# Patient Record
Sex: Female | Born: 1985 | Race: Asian | Hispanic: No | Marital: Married | Smoking: Current every day smoker
Health system: Southern US, Community
[De-identification: ages and names within clinical notes are randomized; demographics above are authoritative.]

## PROBLEM LIST (undated history)

## (undated) DIAGNOSIS — H469 Unspecified optic neuritis: Secondary | ICD-10-CM

## (undated) HISTORY — PX: WISDOM TOOTH EXTRACTION: SHX21

## (undated) HISTORY — PX: ABDOMINAL HERNIA REPAIR: SHX539

---

## 2017-07-02 ENCOUNTER — Encounter (HOSPITAL_COMMUNITY): Payer: Self-pay | Admitting: Emergency Medicine

## 2017-07-02 ENCOUNTER — Other Ambulatory Visit: Payer: Self-pay

## 2017-07-02 ENCOUNTER — Inpatient Hospital Stay (HOSPITAL_COMMUNITY)
Admission: EM | Admit: 2017-07-02 | Discharge: 2017-07-07 | DRG: 059 | Disposition: A | Discharge status: Home / Self Care | Payer: No Typology Code available for payment source | Attending: Internal Medicine | Admitting: Internal Medicine

## 2017-07-02 DIAGNOSIS — H547 Unspecified visual loss: Secondary | ICD-10-CM | POA: Diagnosis not present

## 2017-07-02 DIAGNOSIS — G35 Multiple sclerosis: Secondary | ICD-10-CM | POA: Diagnosis not present

## 2017-07-02 DIAGNOSIS — F17210 Nicotine dependence, cigarettes, uncomplicated: Secondary | ICD-10-CM | POA: Diagnosis present

## 2017-07-02 DIAGNOSIS — H469 Unspecified optic neuritis: Secondary | ICD-10-CM

## 2017-07-02 DIAGNOSIS — Z885 Allergy status to narcotic agent status: Secondary | ICD-10-CM

## 2017-07-02 DIAGNOSIS — G4700 Insomnia, unspecified: Secondary | ICD-10-CM | POA: Diagnosis present

## 2017-07-02 DIAGNOSIS — R51 Headache: Secondary | ICD-10-CM | POA: Diagnosis present

## 2017-07-02 DIAGNOSIS — G360 Neuromyelitis optica [Devic]: Secondary | ICD-10-CM | POA: Diagnosis present

## 2017-07-02 HISTORY — DX: Unspecified optic neuritis: H46.9

## 2017-07-02 LAB — BASIC METABOLIC PANEL
ANION GAP: 7 (ref 5–15)
BUN: <5 mg/dL — ABNORMAL LOW (ref 6–20)
CHLORIDE: 104 mmol/L (ref 101–111)
CO2: 25 mmol/L (ref 22–32)
Calcium: 9 mg/dL (ref 8.9–10.3)
Creatinine, Ser: 0.79 mg/dL (ref 0.44–1.00)
GFR calc Af Amer: >60 mL/min (ref >60)
GLUCOSE: 115 mg/dL — AB (ref 65–99)
POTASSIUM: 4 mmol/L (ref 3.5–5.1)
Sodium: 136 mmol/L (ref 135–145)

## 2017-07-02 LAB — CBC WITH DIFFERENTIAL/PLATELET
Basophils Absolute: 0 10*3/uL (ref 0.0–0.1)
Basophils Relative: 0 %
EOS ABS: 0.1 10*3/uL (ref 0.0–0.7)
EOS PCT: 1 %
HEMATOCRIT: 39.3 % (ref 36.0–46.0)
Hemoglobin: 13.2 g/dL (ref 12.0–15.0)
LYMPHS ABS: 2.3 10*3/uL (ref 0.7–4.0)
LYMPHS PCT: 34 %
MCH: 32.1 pg (ref 26.0–34.0)
MCHC: 33.6 g/dL (ref 30.0–36.0)
MCV: 95.6 fL (ref 78.0–100.0)
MONO ABS: 0.7 10*3/uL (ref 0.1–1.0)
Monocytes Relative: 10 %
Neutro Abs: 3.8 10*3/uL (ref 1.7–7.7)
Neutrophils Relative %: 55 %
Platelets: 185 10*3/uL (ref 150–400)
RBC: 4.11 MIL/uL (ref 3.87–5.11)
RDW: 12.3 % (ref 11.5–15.5)
WBC: 6.8 10*3/uL (ref 4.0–10.5)

## 2017-07-02 LAB — I-STAT BETA HCG BLOOD, ED (MC, WL, AP ONLY): I-stat hCG, quantitative: <5 m[IU]/mL (ref <5)

## 2017-07-02 LAB — I-STAT TROPONIN, ED: TROPONIN I, POC: 0 ng/mL (ref 0.00–0.08)

## 2017-07-02 NOTE — ED Notes (Signed)
Phlebotomy at bedside.

## 2017-07-02 NOTE — ED Triage Notes (Signed)
Pt to ER [facility] for visual loss that occurred Saturday. Seen by ophthalmology and retinal detachment ruled out. Sent here for concern of optic neuritis and is to be seen by neuro. Paperwork has MD contact info. Pt is to have MRI

## 2017-07-02 NOTE — ED Provider Notes (Signed)
[facility] Provider Note   CSN: [id number] Arrival date & time: 07/02/17  1806     History   Chief Complaint Chief Complaint  Patient presents with  . Loss of Vision    HPI Jeanine LuzJami Booth is a 31 y.o. female who presents the emergency department with chief complaint of visual field defect.Her sxs began on Saturday 06/29/17 and she noticed a feeling that something is in her eye. She said that at about 2:00 PM she could only see the upper half of her visual field. She states that it seems like her vision is underwater.[facility] referred there by an optometrist to finish up her exam.  He was then sent to the emergency department to rule out optic neuritis with MRI.  Patient has a history of heavy smoking.  She denies any other neurologic abnormalities. The ophthalmologist did a dilated eye exam that shoed no macular edema or other abnormalities. Normal eye pressures.  VA OD: 20/20, OS 20/80 with corrective lenses.  HPI  History reviewed. No pertinent past medical history.  There are no active problems to display for this patient.   History reviewed. No pertinent surgical history.  OB History    No data available       Home Medications    Prior to Admission medications   Not on File    Family History History reviewed. No pertinent family history.  Social History Social History   Tobacco Use  . Smoking status: Current Every Day Smoker    Packs/day: 0.75    Types: Cigarettes  . Smokeless tobacco: Never Used  Substance Use Topics  . Alcohol use: No    Frequency: Never  . Drug use: No     Allergies   Patient has no allergy information on record.   Review of Systems Review of Systems Ten systems reviewed and are negative for acute change, except as noted in the HPI.    Physical Exam Updated Vital Signs BP 111/84   Pulse (!) 59   Temp 98.6 F (37 C) (Oral)   Resp 16   LMP 06/25/2017    SpO2 98%   Physical Exam  Constitutional: She is oriented to person, place, and time. She appears well-developed and well-nourished. No distress.  HENT:  Head: Normocephalic and atraumatic.  Eyes: Conjunctivae and EOM are normal. Pupils are equal, round, and reactive to light. No scleral icterus.  Neck: Normal range of motion.  Cardiovascular: Normal rate, regular rhythm and normal heart sounds. Exam reveals no gallop and no friction rub.  No murmur heard. Pulmonary/Chest: Effort normal and breath sounds normal. No respiratory distress.  Abdominal: Soft. Bowel sounds are normal. She exhibits no distension and no mass. There is no tenderness. There is no guarding.  Neurological: She is alert and oriented to person, place, and time.  Skin: Skin is warm and dry. She is not diaphoretic.  Psychiatric: Her behavior is normal.  Nursing note and vitals reviewed.    ED Treatments / Results  Labs (all labs ordered are listed, but only abnormal results are displayed) Labs Reviewed  BASIC METABOLIC PANEL - Abnormal; Notable for the following components:      Result Value   Glucose, Bld 115 (*)    BUN <5 (*)    All other components within normal limits  CBC WITH DIFFERENTIAL/PLATELET    EKG  EKG Interpretation None       Radiology No results found.  Procedures  Procedures (including critical care time)  Medications Ordered in ED Medications - No data to display   Initial Impression / Assessment and Plan / ED Course  I have reviewed the triage vital signs and the nursing notes.  Pertinent labs & imaging results that were available during my care of the patient were reviewed by me and considered in my medical decision making (see chart for details).     .patient is in MRI. The patient taken in sign out by PA Ward.   Final Clinical Impressions(s) / ED Diagnoses   Final diagnoses:  None    ED Discharge Orders    None       Arthor Captain, PA-C 07/03/17 0118      Linwood Dibbles, MD 07/04/17 [phone]

## 2017-07-03 ENCOUNTER — Emergency Department (HOSPITAL_COMMUNITY): Payer: No Typology Code available for payment source

## 2017-07-03 ENCOUNTER — Encounter (HOSPITAL_COMMUNITY): Payer: Self-pay | Admitting: Internal Medicine

## 2017-07-03 ENCOUNTER — Other Ambulatory Visit: Payer: Self-pay

## 2017-07-03 DIAGNOSIS — G35 Multiple sclerosis: Principal | ICD-10-CM | POA: Diagnosis present

## 2017-07-03 DIAGNOSIS — F17210 Nicotine dependence, cigarettes, uncomplicated: Secondary | ICD-10-CM | POA: Diagnosis present

## 2017-07-03 DIAGNOSIS — H469 Unspecified optic neuritis: Secondary | ICD-10-CM | POA: Diagnosis not present

## 2017-07-03 DIAGNOSIS — G360 Neuromyelitis optica [Devic]: Secondary | ICD-10-CM | POA: Diagnosis present

## 2017-07-03 DIAGNOSIS — H547 Unspecified visual loss: Secondary | ICD-10-CM | POA: Diagnosis present

## 2017-07-03 DIAGNOSIS — R51 Headache: Secondary | ICD-10-CM | POA: Diagnosis present

## 2017-07-03 DIAGNOSIS — Z885 Allergy status to narcotic agent status: Secondary | ICD-10-CM | POA: Diagnosis not present

## 2017-07-03 DIAGNOSIS — G4700 Insomnia, unspecified: Secondary | ICD-10-CM | POA: Diagnosis present

## 2017-07-03 HISTORY — DX: Unspecified optic neuritis: H46.9

## 2017-07-03 MED ORDER — GADOBENATE DIMEGLUMINE 529 MG/ML IV SOLN
15.0000 mL | Freq: Once | INTRAVENOUS | Status: AC | PRN
  Administered 2017-07-03: 12 mL via INTRAVENOUS

## 2017-07-03 MED ORDER — ONDANSETRON HCL 4 MG/2ML IJ SOLN: 4.0000 mg | Freq: Four times a day (QID) | INTRAMUSCULAR | Status: DC | PRN

## 2017-07-03 MED ORDER — ENOXAPARIN SODIUM 40 MG/0.4ML ~~LOC~~ SOLN
40.0000 mg | SUBCUTANEOUS | Status: DC
  Filled 2017-07-03: qty 0.4

## 2017-07-03 MED ORDER — ONDANSETRON HCL 4 MG PO TABS: 4.0000 mg | ORAL_TABLET | Freq: Four times a day (QID) | ORAL | Status: DC | PRN

## 2017-07-03 MED ORDER — SODIUM CHLORIDE 0.9 % IV SOLN
1000.0000 mg | Freq: Every day | INTRAVENOUS | Status: DC
  Filled 2017-07-03: qty 8

## 2017-07-03 MED ORDER — MELATONIN 3 MG PO TABS
3.0000 mg | ORAL_TABLET | Freq: Every evening | ORAL | Status: DC | PRN
  Administered 2017-07-06: 3 mg via ORAL
  Filled 2017-07-03 (×2): qty 1

## 2017-07-03 MED ORDER — METHYLPREDNISOLONE SODIUM SUCC 1000 MG IJ SOLR
1000.0000 mg | INTRAMUSCULAR | Status: AC
  Administered 2017-07-03 – 2017-07-07 (×5): 1000 mg via INTRAVENOUS
  Filled 2017-07-03 (×5): qty 8

## 2017-07-03 NOTE — Plan of Care (Signed)
  Progressing Health Behavior/Discharge Planning: Ability to manage health-related needs will improve 07/03/2017 1106 - Progressing by Angela Nevin, RN Clinical Measurements: Ability to maintain clinical measurements within normal limits will improve 07/03/2017 1106 - Progressing by Angela Nevin, RN

## 2017-07-03 NOTE — ED Notes (Signed)
Pt. To CT via stretcher. 

## 2017-07-03 NOTE — Consult Note (Addendum)
Neurology Consultation Reason for Consult: left eye vision loss Referring Physician: Dr. Daun Peacock  History is obtained from: Patient  HPI: Tamara Booth is a 31 y.o. female  with no significant past medical history presents with eye pain that began on Thursday progressing to gradual left eye vision loss. She states that first first noticed a headache on Thursday behind her left eye and started noticing that she had blurred vision. This gradually progressed until Sunday her vision became much worse -approximately from  50% to 15%. She tried to go to urgent care, however was instructed to go to an ophthalmologist. Today the patient was examined by an ophthalmologist who was concerned that she may have optic neuritis. Patient had impaired visual acuity and color vision and fundus showed no evidence of papilledema. She was instructed to come to the emergency room.    The patient denies any history of extremity numbness or weakness, memory impairment or gait imbalance. No family history of MS. Brother has alopecia.  MRI of the brain with contrast and MRI orbits with contrast was obtained. The showed enhancement of the left optic nerve as well as multiple supratentorial and infratentorial white matter lesions some of them enhancing suggestive of multiple sclerosis.   ROS: A 14 point ROS was performed and is negative except as noted in the HPI.   History reviewed. No pertinent past medical history.   History reviewed. No pertinent family history. Brother has alopecia.   Social History:  reports that she has been smoking cigarettes.  She has been smoking about 0.75 packs per day. she has never used smokeless tobacco. She reports that she does not drink alcohol or use drugs.   Exam: Current vital signs: BP 107/74   Pulse 63   Temp 98.6 F (37 C) (Oral)   Resp 18   LMP 06/25/2017   SpO2 97%  Vital signs in last 24 hours: Temp:  [98.6 F (37 C)] 98.6 F (37 C) (11/20 1818) Pulse Rate:  [51-80]  63 (11/21 0245) Resp:  [15-18] 18 (11/20 2230) BP: (102-126)/(70-89) 107/74 (11/21 0245) SpO2:  [96 %-100 %] 97 % (11/21 0245)   Physical Exam  Constitutional: Appears well-developed and well-nourished.  Psych: Affect appropriate to situation Eyes: No scleral injection HENT: No OP obstrucion Head: Normocephalic.  Cardiovascular: Normal rate and regular rhythm.  Respiratory: Effort normal and breath sounds normal to anterior ascultation GI: Soft.  No distension. There is no tenderness.  Skin: WDI  Neuro: Mental Status: Patient is awake, alert, oriented to person, place, month, year, and situation. Patient is able to give a clear and coherent history. No signs of aphasia or neglect Fundus: no obvious papilledema  Cranial Nerves: II: Pupils are dilated, round, and reactive to light. Reduced VF in left eye, VA; finger counting. Color vision impaired. R eye : VF and VA normal.  III,IV, VI: EOMI without ptosis or diploplia.  V: Facial sensation is symmetric to temperature VII: Facial movement is symmetric.  VIII: hearing is intact to voice X: Uvula elevates symmetrically XI: Shoulder shrug is symmetric. XII: tongue is midline without atrophy or fasciculations.  Motor Tone is normal. Bulk is normal. 5/5 strength was present in all four extremities.  Sensory: Sensation is symmetric to light touch and temperature in the arms and legs. Deep Tendon Reflexes: 2+ and symmetric in the biceps and patellae.  Plantars: Toes are downgoing bilaterally.  Cerebellar: FNF and HKS are intact bilaterally   I reviewed her MRI of the brain and orbit  ASSESSMENT AND PLAN 31 y.o. female  with no significant past medical history with painful left eye vision loss. MRI orbits consistent with optic neuritis with MRI brain showing multiple white matter hyperintensities and some enhancing lesions. Based on clinical history and MRI findings, this is consistent with MS.   Left eye retrobulbar optic  neuritis Multiple sclerosis  Plan 1 g Solu-Medrol IV to 5 days Pantoprazole 40 mg daily MRI C-spine to assess for any lesions I do not feel patient will need a lumbar puncture as her exam and MRI appears to be consistent with diagnosis of MS. If MRI C-spine shows longitudinal lesions, then we can order NMO antibodies.   Most importantly she will need expedited outpatient follow-up ( 1-2 weeks) at discharge with Dr. Despina Ariasichard Sater or any neuro- immunologist  to start disease modifying therapy for MS to prevent relapses as soon as possible.    Georgiana SpinnerSushanth Riane Rung MD [facility] [phone]  If 7pm to 7am, please call on call as listed on AMION.

## 2017-07-03 NOTE — Progress Notes (Signed)
PROGRESS NOTE    Tamara Booth  KCL:[id number] DOB: November 05, 1985 DOA: 07/02/2017 PCP: Juluis Rainier, MD   Chief Complaint  Patient presents with  . Loss of Vision    Brief Narrative:  HPI on 07/03/2017 by Dr. Madelyn Flavors Tamara Booth is a 31 y.o. female without current past medical history who presents with complaints of having a visual field deficit that has progressively worsened over the last 4 days.  Patient noted initially noted being able to see the sapproximately 50% of her upper visual field, but now only reports being able to see about 15% of her upper visual field.  Approximately 6 days ago patient noted having headaches and some left eye pain with blurred vision. She was evaluated by an ophthalmologist who ultimately sent to the emergency department for further evaluation for concern for optic neuritis.  Patient denies having any focal weakness, nausea, vomiting, fever, chills, gait disturbance.  Patient notes no significant family history of multiple sclerosis to her knowledge.  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 Abnormal enhancement of the cisternal segment of the left optic nerve, consistent with optic neuritis. -Neurology consulted and appreciated, recommending 5 days of high-dose steroids -MRI C-spine pending  Tobacco abuse -discussed cessation  Insomnia -continue melatonin  DVT Prophylaxis    Code Status: lovenox  Family Communication: Husband at bedside  Disposition Plan: admitted, pending 4 additional doses of solumedrol  Consultants Neurology  Procedures  None  Antibiotics   Anti-infectives (From admission, onward)   None      Subjective:   Tamara Booth seen and examined today.  Continues to have  visual field deficits of the left eye. Denies chest pain, shortness of breath, abdominal pain, nausea, vomiting, diarrhea, constipation.   Objective:   Vitals:   07/03/17 0245 07/03/17 0506 07/03/17 0510 07/03/17 1316  BP: 107/74  101/61 (!) 110/53  Pulse: 63  (!) 58 72  Resp:   16 16  Temp:   98.6 F (37 C) 99.2 F (37.3 C)  TempSrc:   Oral Oral  SpO2: 97%  100% 100%  Weight:  61.3 kg (135 lb 1.6 oz)    Height:  5\' 8"  (1.727 m)      Intake/Output Summary (Last 24 hours) at 07/03/2017 1449 Last data filed at 07/03/2017 0929 Gross per 24 hour  Intake 260 ml  Output -  Net 260 ml   Filed Weights   07/03/17 0506  Weight: 61.3 kg (135 lb 1.6 oz)    Exam  General: Well developed, well nourished, NAD, appears stated age  HEENT: NCAT, PERRLA, EOMI, Anicteic Sclera, mucous membranes moist.   Neck: Supple, no JVD, no masses  Cardiovascular: S1 S2 auscultated, no rubs, murmurs or gallops. Regular rate and rhythm.  Respiratory: Clear to auscultation bilaterally with equal chest rise  Abdomen: Soft, nontender, nondistended, + bowel sounds  Extremities: warm dry without cyanosis clubbing or edema  Neuro: AAOx3, reduced lower visual field of the left eye, otherwise, nonfocal   Skin: Without rashes exudates or nodules  Psych: Normal affect and demeanor with intact judgement and insight   Data Reviewed: I have personally reviewed following labs and imaging studies  CBC: Recent Labs  Lab 07/02/17 1834  WBC 6.8  NEUTROABS 3.8  HGB 13.2  HCT 39.3  MCV 95.6  PLT  185   Basic Metabolic Panel: Recent Labs  Lab 07/02/17 1834  NA 136  K 4.0  CL 104  CO2 25  GLUCOSE 115*  BUN <5*  CREATININE 0.79  CALCIUM 9.0   GFR: Estimated Creatinine Clearance: 96.8 mL/min (by C-G formula based on SCr of 0.79 mg/dL). Liver Function Tests: No results for input(s): AST, ALT, ALKPHOS, BILITOT, PROT, ALBUMIN in the last 168 hours. No results for input(s): LIPASE, AMYLASE in the  last 168 hours. No results for input(s): AMMONIA in the last 168 hours. Coagulation Profile: No results for input(s): INR, PROTIME in the last 168 hours. Cardiac Enzymes: No results for input(s): CKTOTAL, CKMB, CKMBINDEX, TROPONINI in the last 168 hours. BNP (last 3 results) No results for input(s): PROBNP in the last 8760 hours. HbA1C: No results for input(s): HGBA1C in the last 72 hours. CBG: No results for input(s): GLUCAP in the last 168 hours. Lipid Profile: No results for input(s): CHOL, HDL, LDLCALC, TRIG, CHOLHDL, LDLDIRECT in the last 72 hours. Thyroid Function Tests: No results for input(s): TSH, T4TOTAL, FREET4, T3FREE, THYROIDAB in the last 72 hours. Anemia Panel: No results for input(s): VITAMINB12, FOLATE, FERRITIN, TIBC, IRON, RETICCTPCT in the last 72 hours. Urine analysis: No results found for: COLORURINE, APPEARANCEUR, LABSPEC, PHURINE, GLUCOSEU, HGBUR, BILIRUBINUR, KETONESUR, PROTEINUR, UROBILINOGEN, NITRITE, LEUKOCYTESUR Sepsis Labs: @LABRCNTIP (procalcitonin:4,lacticidven:4)  )No results found for this or any previous visit (from the past 240 hour(s)).    Radiology Studies: [name] ZO Contrast  Result Date: 07/03/2017 CLINICAL DATA:  Visual field defect. Loss of upper half of the visual field. EXAM: MRI HEAD AND ORBITS WITHOUT AND WITH CONTRAST TECHNIQUE: Multiplanar, multiecho pulse sequences of the brain and surrounding structures were obtained without and with intravenous contrast. Multiplanar, multiecho pulse sequences of the orbits and surrounding structures were obtained including fat saturation techniques, before and after intravenous contrast administration. CONTRAST:  12mL MULTIHANCE GADOBENATE DIMEGLUMINE 529 MG/ML IV SOLN COMPARISON:  None. FINDINGS: MRI HEAD FINDINGS Brain: There is a tiny cyst measuring 11 mm. There is no acute infarct or acute hemorrhage. No mass lesion, hydrocephalus, dural abnormality or extra-axial collection. There are multiple  hyperintense T2-weighted signal lesionswithin the subcortical and deep supratentorial white matter and within the left cerebellum and brainstem. The most of the supratentorial lesions are oriented perpendicularly to the long axis of the lateral ventricles. The lesion in the right frontal operculum shows contrast enhancement. No other contrast-enhancing lesions. Possible diffusion restriction at the lesion of the left middle cerebellar peduncles. No age-advanced or lobar predominant atrophy. No chronic microhemorrhage or superficial siderosis. Vascular: Major intracranial arterial and venous sinus flow voids are preserved. Skull and upper cervical spine: The visualized skull base, calvarium, upper cervical spine and extracranial soft tissues are normal. MRI ORBITS FINDINGS Orbits: --Globes: Normal. --Bony orbit: Normal. --Preseptal soft tissues: Normal. --Intra- and extraconal orbital fat:  Normal. --Optic nerves: There is abnormal contrast enhancement of the cisternal segment of the left optic nerve, proximal to its entry into the optic canal. There is no abnormal enhancement or T2-weighted signal of the intraorbital optic nerve. The right optic nerve is normal. --Lacrimal glands and fossae: Normal. --Extraocular muscles: Normal. The Visualized sinuses:  No fluid levels or advanced mucosal thickening. Soft tissues: Normal. IMPRESSION: 1. Multiple supratentorial and infratentorial white matter lesions, including a contrast-enhancing lesion of the right frontal operculum. The pattern is most consistent with demyelinating disease and both multiple sclerosis and neuromyelitis optica spectrum disorders are possibilities. CSF sampling should be considered. 2. Abnormal  enhancement of the cisternal segment of the left optic nerve, proximal to its entry into the optic canal is also consistent with optic neuritis in the setting of demyelinating disease. 3. No acute ischemia. Electronically Signed   By: Deatra RobinsonKevin  Herman M.D.    On: 07/03/2017 02:46   Mr Rockwell GermanyOrbits W ZOWo Contrast  Result Date: 07/03/2017 CLINICAL DATA:  Visual field defect. Loss of upper half of the visual field. EXAM: MRI HEAD AND ORBITS WITHOUT AND WITH CONTRAST TECHNIQUE: Multiplanar, multiecho pulse sequences of the brain and surrounding structures were obtained without and with intravenous contrast. Multiplanar, multiecho pulse sequences of the orbits and surrounding structures were obtained including fat saturation techniques, before and after intravenous contrast administration. CONTRAST:  12mL MULTIHANCE GADOBENATE DIMEGLUMINE 529 MG/ML IV SOLN COMPARISON:  None. FINDINGS: MRI HEAD FINDINGS Brain: There is a tiny cyst measuring 11 mm. There is no acute infarct or acute hemorrhage. No mass lesion, hydrocephalus, dural abnormality or extra-axial collection. There are multiple hyperintense T2-weighted signal lesionswithin the subcortical and deep supratentorial white matter and within the left cerebellum and brainstem. The most of the supratentorial lesions are oriented perpendicularly to the long axis of the lateral ventricles. The lesion in the right frontal operculum shows contrast enhancement. No other contrast-enhancing lesions. Possible diffusion restriction at the lesion of the left middle cerebellar peduncles. No age-advanced or lobar predominant atrophy. No chronic microhemorrhage or superficial siderosis. Vascular: Major intracranial arterial and venous sinus flow voids are preserved. Skull and upper cervical spine: The visualized skull base, calvarium, upper cervical spine and extracranial soft tissues are normal. MRI ORBITS FINDINGS Orbits: --Globes: Normal. --Bony orbit: Normal. --Preseptal soft tissues: Normal. --Intra- and extraconal orbital fat:  Normal. --Optic nerves: There is abnormal contrast enhancement of the cisternal segment of the left optic nerve, proximal to its entry into the optic canal. There is no abnormal enhancement or T2-weighted  signal of the intraorbital optic nerve. The right optic nerve is normal. --Lacrimal glands and fossae: Normal. --Extraocular muscles: Normal. The Visualized sinuses:  No fluid levels or advanced mucosal thickening. Soft tissues: Normal. IMPRESSION: 1. Multiple supratentorial and infratentorial white matter lesions, including a contrast-enhancing lesion of the right frontal operculum. The pattern is most consistent with demyelinating disease and both multiple sclerosis and neuromyelitis optica spectrum disorders are possibilities. CSF sampling should be considered. 2. Abnormal enhancement of the cisternal segment of the left optic nerve, proximal to its entry into the optic canal is also consistent with optic neuritis in the setting of demyelinating disease. 3. No acute ischemia. Electronically Signed   By: Deatra RobinsonKevin  Herman M.D.   On: 07/03/2017 02:46     Scheduled Meds: . enoxaparin (LOVENOX) injection  40 mg Subcutaneous Q24H   Continuous Infusions: . methylPREDNISolone (SOLU-MEDROL) injection Stopped (07/03/17 0739)     LOS: 0 days   Time Spent in minutes   30 minutes  Deondra Labrador D.O. on 07/03/2017 at 2:49 PM  Between 7am to 7pm - Pager - [phone]  After 7pm go to [email or web address] - password [email or web address]  And look for the night coverage person covering for me after hours  [facility] Office  [phone]

## 2017-07-03 NOTE — ED Provider Notes (Signed)
Care assumed from previous provider PA Harris. Please see note for further details. Case discussed, plan agreed upon. Briefly, patient is a 31 y.o. female sent to ER from ophthalmology for imaging to rule out optic neuritis.  Patient has been experiencing unilateral visual filed loss. Will follow up on pending MRI brain and orbits with likely neurology consultation.  MRI shows multiple lesions concerning for multiple sclerosis and neuromyelitis optica spectrum disorders as well as abnormal enhancement of the left optic nerve consistent with optic neuritis.   Neurology consulted who evaluated patient and recommends 1g solu-medrol daily and medical admission.   Hospitalist consulted who will admit.     Noelie Renfrow, Chase PicketJaime Pilcher, PA-C 07/03/17 0335    Cy BlamerPalumbo, April, MD 07/03/17 [id number]

## 2017-07-03 NOTE — ED Notes (Signed)
Spoke with MRI, MR cervical spine is able to be outpatient.

## 2017-07-03 NOTE — Plan of Care (Signed)
Patient progressing 

## 2017-07-03 NOTE — ED Notes (Signed)
Admitting MD at bedside.

## 2017-07-03 NOTE — Progress Notes (Signed)
Patient received from ED. Patient is alert and oriented. Vital signs are stable. Skin assessment done with another nurse found intact. Patient denies for pain. Patient given instructions  about call bell, phone and unit routine. Bed in low position and side rail up x2. Call bell in reach.

## 2017-07-03 NOTE — H&P (Signed)
History and Physical    Tamara Booth RUE:[id number] DOB: 1986/05/24 DOA: 07/02/2017  Referring MD/NP/PA:Jamie Ward, PA-C PCP: Juluis Rainier, MD  Patient coming from: [place] from [facility]  Chief Complaint: Visual change  I have personally briefly reviewed patient's old medical records in [facility]   HPI: Tamara Booth is a 31 y.o. female without current past medical history who presents with complaints of having a visual field deficit that has progressively worsened over the last 4 days.  Patient noted initially noted being able to see the sapproximately 50% of her upper visual field, but now only reports being able to see about 15% of her upper visual field.  Approximately 6 days ago patient noted having headaches and some left eye pain with blurred vision. She was evaluated by an ophthalmologist who ultimately sent to the emergency department for further evaluation for concern for optic neuritis.  Patient denies having any focal weakness, nausea, vomiting, fever, chills, gait disturbance.  Patient notes no significant family history of multiple sclerosis to her knowledge.  ED Course: Patient was seen to be afebrile with vital signs relatively within normal limits.  Labs were relatively unremarkable.  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  Review of Systems  Constitutional: Negative for chills, fever and weight loss.  HENT: Negative for ear discharge and nosebleeds.   Eyes: Positive for pain.       Positive for change in vision  Respiratory: Negative for cough and sputum production.   Cardiovascular: Negative for chest pain and orthopnea.  Gastrointestinal: Negative for abdominal pain, constipation, diarrhea and vomiting.  Genitourinary: Negative for dysuria and frequency.  Musculoskeletal: Negative for myalgias and neck pain.  Skin:  Negative for itching and rash.  Neurological: Positive for tremors.  Psychiatric/Behavioral: Negative for hallucinations and substance abuse.    History reviewed. No pertinent past medical history.  Past Surgical History:  Procedure Laterality Date  . ABDOMINAL HERNIA REPAIR     at the age of 27     reports that she has been smoking cigarettes.  She has been smoking about 0.75 packs per day. she has never used smokeless tobacco. She reports that she does not drink alcohol or use drugs.  Allergies  Allergen Reactions  . Vicodin [Hydrocodone-Acetaminophen] Shortness Of Breath and Itching    History reviewed. No pertinent family history of multiple sclerosis.  Prior to Admission medications   Medication Sig Start Date End Date Taking? Authorizing Provider  Melatonin 2.5 MG CAPS Take 2.5 mg by mouth at bedtime.   Yes [provider]    Physical Exam:  Constitutional: NAD, calm, comfortable Vitals:   07/02/17 2330 07/02/17 2345 07/03/17 0000 07/03/17 0245  BP: 110/80 107/77 103/71 107/74  Pulse: 66 65 62 63  Resp:      Temp:      TempSrc:      SpO2: 98% 99% 98% 97%   Eyes: PERRL, lids and conjunctivae normal.  Patient with decreased visual acuity. ENMT: Mucous membranes are moist. Posterior pharynx clear of any exudate or lesions.Normal dentition.  Neck: normal, supple, no masses, no thyromegaly Respiratory: clear to auscultation bilaterally, no wheezing, no crackles. Normal respiratory effort. No accessory muscle use.  Cardiovascular: Regular rate and rhythm, no murmurs / rubs / gallops. No extremity edema. 2+ pedal pulses. No carotid bruits.  Abdomen: no tenderness, no masses palpated. No hepatosplenomegaly. Bowel sounds positive.  Musculoskeletal:  no clubbing / cyanosis. No joint deformity upper and lower extremities. Good ROM, no contractures. Normal muscle tone.  Skin: no rashes, lesions, ulcers. No induration Neurologic: CN 2-12 grossly intact. Sensation  intact, DTR normal. Strength 5/5 in all 4.  Psychiatric: Normal judgment and insight. Alert and oriented x 3. Normal mood.     Labs on Admission: I have personally reviewed following labs and imaging studies  CBC: Recent Labs  Lab 07/02/17 1834  WBC 6.8  NEUTROABS 3.8  HGB 13.2  HCT 39.3  MCV 95.6  PLT 185   Basic Metabolic Panel: Recent Labs  Lab 07/02/17 1834  NA 136  K 4.0  CL 104  CO2 25  GLUCOSE 115*  BUN <5*  CREATININE 0.79  CALCIUM 9.0   GFR: CrCl cannot be calculated (Unknown ideal weight.). Liver Function Tests: No results for input(s): AST, ALT, ALKPHOS, BILITOT, PROT, ALBUMIN in the last 168 hours. No results for input(s): LIPASE, AMYLASE in the last 168 hours. No results for input(s): AMMONIA in the last 168 hours. Coagulation Profile: No results for input(s): INR, PROTIME in the last 168 hours. Cardiac Enzymes: No results for input(s): CKTOTAL, CKMB, CKMBINDEX, TROPONINI in the last 168 hours. BNP (last 3 results) No results for input(s): PROBNP in the last 8760 hours. HbA1C: No results for input(s): HGBA1C in the last 72 hours. CBG: No results for input(s): GLUCAP in the last 168 hours. Lipid Profile: No results for input(s): CHOL, HDL, LDLCALC, TRIG, CHOLHDL, LDLDIRECT in the last 72 hours. Thyroid Function Tests: No results for input(s): TSH, T4TOTAL, FREET4, T3FREE, THYROIDAB in the last 72 hours. Anemia Panel: No results for input(s): VITAMINB12, FOLATE, FERRITIN, TIBC, IRON, RETICCTPCT in the last 72 hours. Urine analysis: No results found for: COLORURINE, APPEARANCEUR, LABSPEC, PHURINE, GLUCOSEU, HGBUR, BILIRUBINUR, KETONESUR, PROTEINUR, UROBILINOGEN, NITRITE, LEUKOCYTESUR Sepsis Labs: No results found for this or any previous visit (from the past 240 hour(s)).   Radiological Exams on Admission: [name] YE Contrast  Result Date: 07/03/2017 CLINICAL DATA:  Visual field defect. Loss of upper half of the visual field. EXAM: MRI HEAD  AND ORBITS WITHOUT AND WITH CONTRAST TECHNIQUE: Multiplanar, multiecho pulse sequences of the brain and surrounding structures were obtained without and with intravenous contrast. Multiplanar, multiecho pulse sequences of the orbits and surrounding structures were obtained including fat saturation techniques, before and after intravenous contrast administration. CONTRAST:  44mL MULTIHANCE GADOBENATE DIMEGLUMINE 529 MG/ML IV SOLN COMPARISON:  None. FINDINGS: MRI HEAD FINDINGS Brain: There is a tiny cyst measuring 11 mm. There is no acute infarct or acute hemorrhage. No mass lesion, hydrocephalus, dural abnormality or extra-axial collection. There are multiple hyperintense T2-weighted signal lesionswithin the subcortical and deep supratentorial white matter and within the left cerebellum and brainstem. The most of the supratentorial lesions are oriented perpendicularly to the long axis of the lateral ventricles. The lesion in the right frontal operculum shows contrast enhancement. No other contrast-enhancing lesions. Possible diffusion restriction at the lesion of the left middle cerebellar peduncles. No age-advanced or lobar predominant atrophy. No chronic microhemorrhage or superficial siderosis. Vascular: Major intracranial arterial and venous sinus flow voids are preserved. Skull and upper cervical spine: The visualized skull base, calvarium, upper cervical spine and extracranial soft tissues are normal. MRI ORBITS FINDINGS Orbits: --Globes: Normal. --Bony orbit: Normal. --Preseptal soft tissues: Normal. --Intra- and extraconal orbital fat:  Normal. --Optic nerves: There is abnormal contrast enhancement of the cisternal segment of the left optic nerve, proximal to its entry into the optic  canal. There is no abnormal enhancement or T2-weighted signal of the intraorbital optic nerve. The right optic nerve is normal. --Lacrimal glands and fossae: Normal. --Extraocular muscles: Normal. The Visualized sinuses:  No  fluid levels or advanced mucosal thickening. Soft tissues: Normal. IMPRESSION: 1. Multiple supratentorial and infratentorial white matter lesions, including a contrast-enhancing lesion of the right frontal operculum. The pattern is most consistent with demyelinating disease and both multiple sclerosis and neuromyelitis optica spectrum disorders are possibilities. CSF sampling should be considered. 2. Abnormal enhancement of the cisternal segment of the left optic nerve, proximal to its entry into the optic canal is also consistent with optic neuritis in the setting of demyelinating disease. 3. No acute ischemia. Electronically Signed   By: Deatra Robinson M.D.   On: 07/03/2017 02:46   Mr Rockwell [place] RU Contrast  Result Date: 07/03/2017 CLINICAL DATA:  Visual field defect. Loss of upper half of the visual field. EXAM: MRI HEAD AND ORBITS WITHOUT AND WITH CONTRAST TECHNIQUE: Multiplanar, multiecho pulse sequences of the brain and surrounding structures were obtained without and with intravenous contrast. Multiplanar, multiecho pulse sequences of the orbits and surrounding structures were obtained including fat saturation techniques, before and after intravenous contrast administration. CONTRAST:  12mL MULTIHANCE GADOBENATE DIMEGLUMINE 529 MG/ML IV SOLN COMPARISON:  None. FINDINGS: MRI HEAD FINDINGS Brain: There is a tiny cyst measuring 11 mm. There is no acute infarct or acute hemorrhage. No mass lesion, hydrocephalus, dural abnormality or extra-axial collection. There are multiple hyperintense T2-weighted signal lesionswithin the subcortical and deep supratentorial white matter and within the left cerebellum and brainstem. The most of the supratentorial lesions are oriented perpendicularly to the long axis of the lateral ventricles. The lesion in the right frontal operculum shows contrast enhancement. No other contrast-enhancing lesions. Possible diffusion restriction at the lesion of the left middle cerebellar  peduncles. No age-advanced or lobar predominant atrophy. No chronic microhemorrhage or superficial siderosis. Vascular: Major intracranial arterial and venous sinus flow voids are preserved. Skull and upper cervical spine: The visualized skull base, calvarium, upper cervical spine and extracranial soft tissues are normal. MRI ORBITS FINDINGS Orbits: --Globes: Normal. --Bony orbit: Normal. --Preseptal soft tissues: Normal. --Intra- and extraconal orbital fat:  Normal. --Optic nerves: There is abnormal contrast enhancement of the cisternal segment of the left optic nerve, proximal to its entry into the optic canal. There is no abnormal enhancement or T2-weighted signal of the intraorbital optic nerve. The right optic nerve is normal. --Lacrimal glands and fossae: Normal. --Extraocular muscles: Normal. The Visualized sinuses:  No fluid levels or advanced mucosal thickening. Soft tissues: Normal. IMPRESSION: 1. Multiple supratentorial and infratentorial white matter lesions, including a contrast-enhancing lesion of the right frontal operculum. The pattern is most consistent with demyelinating disease and both multiple sclerosis and neuromyelitis optica spectrum disorders are possibilities. CSF sampling should be considered. 2. Abnormal enhancement of the cisternal segment of the left optic nerve, proximal to its entry into the optic canal is also consistent with optic neuritis in the setting of demyelinating disease. 3. No acute ischemia. Electronically Signed   By: Deatra Robinson M.D.   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  Patient presents with  visual field deficit.  To have multiple white matter lesions on MRI given concern for multiple sclerosis.  Neurology evaluated and recommending high-dose steroids. - Admit to MedSurg bed - 1 g Soulmedrol IV q 24 hrs - MRI of C-spine w w/o contrast  -  Appreciate neurology consultative services, follow-up for further  recommendations   DVT prophylaxis: lovenox Code Status: Full  Family Communication: This plan of care with the patient and family present at bedside Disposition Plan: Likely discharge home in 3-5 days Consults called: Neurology Admission status: Inpatient  Clydie Braunondell A Prisilla Kocsis MD [facility] Pager [phone]   If 7PM-7AM, please contact night-coverage [email or web address] Password [email or web address]  07/03/2017, 3:31 AM

## 2017-07-03 NOTE — ED Notes (Signed)
MD at bedside. 

## 2017-07-03 NOTE — ED Notes (Signed)
Pt. Return from CT via stretcher. 

## 2017-07-04 DIAGNOSIS — R51 Headache: Secondary | ICD-10-CM

## 2017-07-04 MED ORDER — ACETAMINOPHEN 325 MG PO TABS
650.0000 mg | ORAL_TABLET | Freq: Four times a day (QID) | ORAL | Status: DC | PRN
  Administered 2017-07-04 – 2017-07-06 (×3): 650 mg via ORAL
  Filled 2017-07-04 (×3): qty 2

## 2017-07-04 NOTE — Plan of Care (Signed)
  Progressing Health Behavior/Discharge Planning: Ability to manage health-related needs will improve 07/04/2017 1125 - Progressing by Marice Potter, RN Nutrition: Adequate nutrition will be maintained 07/04/2017 1125 - Progressing by Marice Potter, RN   Completed/Met Activity: Risk for activity intolerance will decrease 07/04/2017 1125 - Completed/Met by Marice Potter, RN

## 2017-07-04 NOTE — Progress Notes (Signed)
Pt complains of headache 5 out of 10. No prn orders. Pt states takes tylenol at home. MD paged

## 2017-07-04 NOTE — Progress Notes (Signed)
Iv removed per pt request said it feels uncomfortable

## 2017-07-04 NOTE — Progress Notes (Signed)
PROGRESS NOTE    Tamara Booth  ZOX:[id number] DOB: Feb 15, 1986 DOA: 07/02/2017 PCP: Juluis RainierBarnes, Elizabeth, MD   Chief Complaint  Patient presents with  . Loss of Vision    Brief Narrative:  HPI on 07/03/2017 by Dr. Madelyn Flavorsondell Smith Tamara LuzJami Ratledge is a 31 y.o. female without current past medical history who presents with complaints of having a visual field deficit that has progressively worsened over the last 4 days.  Patient noted initially noted being able to see the sapproximately 50% of her upper visual field, but now only reports being able to see about 15% of her upper visual field.  Approximately 6 days ago patient noted having headaches and some left eye pain with blurred vision. She was evaluated by an ophthalmologist who ultimately sent to the emergency department for further evaluation for concern for optic neuritis.  Patient denies having any focal weakness, nausea, vomiting, fever, chills, gait disturbance.  Patient notes no significant family history of multiple sclerosis to her knowledge.  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 Abnormal enhancement of the cisternal segment of the left optic nerve, consistent with optic neuritis. -Neurology consulted and appreciated, recommending 5 days of high-dose steroids- started on 07/03/2017 -MRI C-spine pending  Tobacco abuse -discussed cessation  Insomnia -continue melatonin  Headache -tylenol ordered  DVT Prophylaxis    Code Status: lovenox  Family Communication: none at bedside  Disposition Plan: admitted, pending 3 additional doses of solumedrol  Consultants Neurology  Procedures  None  Antibiotics   Anti-infectives (From admission, onward)   None      Subjective:   Tamara LuzJami Tulloch  seen and examined today.  Continues to have left visual disturbances. Denies chest pain, shortness of breath, abdominal pain, nausea, vomiting, diarrhea, constipation. .   Objective:   Vitals:   07/03/17 0510 07/03/17 1316 07/03/17 2030 07/04/17 0506  BP: 101/61 (!) 110/53 (!) 104/59 109/63  Pulse: (!) 58 72 71 66  Resp: 16 16 18 18   Temp: 98.6 F (37 C) 99.2 F (37.3 C) 97.9 F (36.6 C) 98.4 F (36.9 C)  TempSrc: Oral Oral Oral Oral  SpO2: 100% 100% 100% 97%  Weight:      Height:        Intake/Output Summary (Last 24 hours) at 07/04/2017 1328 Last data filed at 07/04/2017 [id number] Gross per 24 hour  Intake 1276 ml  Output -  Net 1276 ml   Filed Weights   07/03/17 0506  Weight: 61.3 kg (135 lb 1.6 oz)   Exam  General: Well developed, well nourished, NAD, appears stated age  HEENT: NCAT, mucous membranes moist.   Cardiovascular: S1 S2 auscultated, no rubs, murmurs or gallops. Regular rate and rhythm.  Respiratory: Clear to auscultation bilaterally with equal chest rise  Abdomen: Soft, nontender, nondistended, + bowel sounds  Extremities: warm dry without cyanosis clubbing or edema  Neuro: AAOx3, reduced vision of the left eye, otherwise nonfocal  Psych: Appropriate mood and affect  Data Reviewed: I have personally reviewed following labs and imaging studies  CBC: Recent Labs  Lab 07/02/17 1834  WBC 6.8  NEUTROABS 3.8  HGB 13.2  HCT 39.3  MCV 95.6  PLT 185   Basic Metabolic Panel: Recent Labs  Lab 07/02/17 1834  NA 136  K 4.0  CL 104  CO2 25  GLUCOSE 115*  BUN <5*  CREATININE 0.79  CALCIUM 9.0   GFR: Estimated Creatinine Clearance: 96.8 mL/min (by C-G formula based on SCr of 0.79 mg/dL). Liver Function Tests: No results for input(s): AST, ALT, ALKPHOS, BILITOT, PROT, ALBUMIN in the last 168 hours. No results for input(s): LIPASE, AMYLASE in the last 168 hours. No results for input(s): AMMONIA in the last 168 hours. Coagulation Profile: No  results for input(s): INR, PROTIME in the last 168 hours. Cardiac Enzymes: No results for input(s): CKTOTAL, CKMB, CKMBINDEX, TROPONINI in the last 168 hours. BNP (last 3 results) No results for input(s): PROBNP in the last 8760 hours. HbA1C: No results for input(s): HGBA1C in the last 72 hours. CBG: No results for input(s): GLUCAP in the last 168 hours. Lipid Profile: No results for input(s): CHOL, HDL, LDLCALC, TRIG, CHOLHDL, LDLDIRECT in the last 72 hours. Thyroid Function Tests: No results for input(s): TSH, T4TOTAL, FREET4, T3FREE, THYROIDAB in the last 72 hours. Anemia Panel: No results for input(s): VITAMINB12, FOLATE, FERRITIN, TIBC, IRON, RETICCTPCT in the last 72 hours. Urine analysis: No results found for: COLORURINE, APPEARANCEUR, LABSPEC, PHURINE, GLUCOSEU, HGBUR, BILIRUBINUR, KETONESUR, PROTEINUR, UROBILINOGEN, NITRITE, LEUKOCYTESUR Sepsis Labs: @LABRCNTIP (procalcitonin:4,lacticidven:4)  )No results found for this or any previous visit (from the past 240 hour(s)).    Radiology Studies: [name] KW Contrast  Result Date: 07/03/2017 CLINICAL DATA:  Visual field defect. Loss of upper half of the visual field. EXAM: MRI HEAD AND ORBITS WITHOUT AND WITH CONTRAST TECHNIQUE: Multiplanar, multiecho pulse sequences of the brain and surrounding structures were obtained without and with intravenous contrast. Multiplanar, multiecho pulse sequences of the orbits and surrounding structures were obtained including fat saturation techniques, before and after intravenous contrast administration. CONTRAST:  3mL MULTIHANCE GADOBENATE DIMEGLUMINE 529 MG/ML IV SOLN COMPARISON:  None. FINDINGS: MRI HEAD FINDINGS Brain: There is a tiny cyst measuring 11 mm. There is no acute infarct or acute hemorrhage. No mass lesion, hydrocephalus, dural abnormality or extra-axial collection. There are multiple hyperintense T2-weighted signal lesionswithin the subcortical and deep supratentorial white matter  and within the left cerebellum and brainstem. The most of the supratentorial lesions are oriented perpendicularly to the long axis of the lateral ventricles. The lesion in the right frontal operculum shows contrast enhancement. No other contrast-enhancing lesions. Possible diffusion restriction at the lesion of the left middle cerebellar peduncles. No age-advanced or lobar predominant atrophy. No chronic microhemorrhage or superficial siderosis. Vascular: Major intracranial arterial and venous sinus flow voids are preserved. Skull and upper cervical spine: The visualized skull base, calvarium, upper cervical spine and extracranial soft tissues are normal. MRI ORBITS FINDINGS Orbits: --Globes: Normal. --Bony orbit: Normal. --Preseptal soft tissues: Normal. --Intra- and extraconal orbital fat:  Normal. --Optic nerves: There is abnormal contrast enhancement of the cisternal segment of the left optic nerve, proximal to its entry into the optic canal. There is no abnormal enhancement or T2-weighted signal of the intraorbital optic nerve. The right optic nerve is normal. --Lacrimal glands and fossae: Normal. --Extraocular muscles: Normal. The Visualized sinuses:  No fluid levels or advanced mucosal thickening. Soft tissues: Normal. IMPRESSION: 1. Multiple supratentorial and infratentorial white matter lesions, including a contrast-enhancing lesion of the right frontal operculum. The pattern is most consistent with demyelinating disease and both multiple sclerosis and neuromyelitis optica spectrum disorders are possibilities. CSF sampling should be considered. 2. Abnormal enhancement of the cisternal segment of the left optic nerve, proximal to its entry into the optic canal is also consistent with optic neuritis in  the setting of demyelinating disease. 3. No acute ischemia. Electronically Signed   By: Deatra Robinson M.D.   On: 07/03/2017 02:46   Mr Rockwell [place] ZO Contrast  Result Date: 07/03/2017 CLINICAL DATA:  Visual  field defect. Loss of upper half of the visual field. EXAM: MRI HEAD AND ORBITS WITHOUT AND WITH CONTRAST TECHNIQUE: Multiplanar, multiecho pulse sequences of the brain and surrounding structures were obtained without and with intravenous contrast. Multiplanar, multiecho pulse sequences of the orbits and surrounding structures were obtained including fat saturation techniques, before and after intravenous contrast administration. CONTRAST:  12mL MULTIHANCE GADOBENATE DIMEGLUMINE 529 MG/ML IV SOLN COMPARISON:  None. FINDINGS: MRI HEAD FINDINGS Brain: There is a tiny cyst measuring 11 mm. There is no acute infarct or acute hemorrhage. No mass lesion, hydrocephalus, dural abnormality or extra-axial collection. There are multiple hyperintense T2-weighted signal lesionswithin the subcortical and deep supratentorial white matter and within the left cerebellum and brainstem. The most of the supratentorial lesions are oriented perpendicularly to the long axis of the lateral ventricles. The lesion in the right frontal operculum shows contrast enhancement. No other contrast-enhancing lesions. Possible diffusion restriction at the lesion of the left middle cerebellar peduncles. No age-advanced or lobar predominant atrophy. No chronic microhemorrhage or superficial siderosis. Vascular: Major intracranial arterial and venous sinus flow voids are preserved. Skull and upper cervical spine: The visualized skull base, calvarium, upper cervical spine and extracranial soft tissues are normal. MRI ORBITS FINDINGS Orbits: --Globes: Normal. --Bony orbit: Normal. --Preseptal soft tissues: Normal. --Intra- and extraconal orbital fat:  Normal. --Optic nerves: There is abnormal contrast enhancement of the cisternal segment of the left optic nerve, proximal to its entry into the optic canal. There is no abnormal enhancement or T2-weighted signal of the intraorbital optic nerve. The right optic nerve is normal. --Lacrimal glands and fossae:  Normal. --Extraocular muscles: Normal. The Visualized sinuses:  No fluid levels or advanced mucosal thickening. Soft tissues: Normal. IMPRESSION: 1. Multiple supratentorial and infratentorial white matter lesions, including a contrast-enhancing lesion of the right frontal operculum. The pattern is most consistent with demyelinating disease and both multiple sclerosis and neuromyelitis optica spectrum disorders are possibilities. CSF sampling should be considered. 2. Abnormal enhancement of the cisternal segment of the left optic nerve, proximal to its entry into the optic canal is also consistent with optic neuritis in the setting of demyelinating disease. 3. No acute ischemia. Electronically Signed   By: Deatra Robinson M.D.   On: 07/03/2017 02:46     Scheduled Meds: . enoxaparin (LOVENOX) injection  40 mg Subcutaneous Q24H   Continuous Infusions: . methylPREDNISolone (SOLU-MEDROL) injection Stopped (07/04/17 0733)     LOS: 1 day   Time Spent in minutes   30 minutes  Susie Ehresman D.O. on 07/04/2017 at 1:28 PM  Between 7am to 7pm - Pager - [phone]  After 7pm go to [email or web address] - password [email or web address]  And look for the night coverage person covering for me after hours  [facility] Office  [phone]

## 2017-07-05 ENCOUNTER — Inpatient Hospital Stay (HOSPITAL_COMMUNITY): Payer: No Typology Code available for payment source

## 2017-07-05 MED ORDER — GADOBENATE DIMEGLUMINE 529 MG/ML IV SOLN
13.0000 mL | Freq: Once | INTRAVENOUS | Status: DC | PRN
Start: 2017-07-05 — End: 2017-07-07

## 2017-07-05 NOTE — Progress Notes (Signed)
PROGRESS NOTE  Jeanine LuzJami Pomeroy ZOX:[id number] DOB: February 25, 1986 DOA: 07/02/2017 PCP: Juluis RainierBarnes, Elizabeth, MD  HPI/Recap of past 24 hours: HPI on 07/03/2017 by Dr. Vassie Moselleondell Smith Lakenzie Bermanis a 31 y.o.femalewithoutcurrent past medical history who presents with complaints of having a visual field deficitthat has progressively worsened over the last4 days.Patient noted initially noted being ableto see the sapproximately 50% of her upper visual field, but now only reports being able to see about 15% of her upper visual field. Approximately 6 days ago patient noted having headaches and some left eye pain with blurred vision. She was evaluated by anophthalmologist who ultimatelysent to the emergency department for further evaluation for concern for optic neuritis. Patient denies having any focal weakness, nausea, vomiting, fever, chills, gait disturbance. Patient notes no significant family history of multiple sclerosis to her knowledge.  Today, pt reported feeling better overall.  Reports mild improvement of vision in the left eye.  Denies any worsening headaches, focal neurologic weakness, nausea/vomiting, fever/chills, dysuria, chest pain, abdominal pain, diarrhea/constipation.  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 Abnormal enhancement of the cisternal segment of the left optic nerve, consistent with optic neuritis. -Neurology consulted and appreciated, recommending 5 days of high-dose steroids- started on 07/03/2017 -MRI C-spine showed Negative MRI of the cervical spine. No focal T2 signal change or enhancement to suggest demyelinating disease in cervical spine  Tobacco  abuse -discussed cessation  Insomnia -continue melatonin  Headache -tylenol ordered    Code Status: Full  Family Communication: Significant other at bedside  Disposition Plan: Discharge once IV steroids is completed on 11/25   Consultants:  Neurology  Procedures:  None  Antimicrobials:  None  DVT prophylaxis: Lovenox   Objective: Vitals:   07/04/17 1402 07/04/17 2102 07/05/17 0548 07/05/17 1428  BP: (!) 109/56 115/73 102/68 (!) 101/58  Pulse: 77 80 62 73  Resp: 18 18 18 18   Temp: 98.6 F (37 C) 98.6 F (37 C) 98.5 F (36.9 C) 98.7 F (37.1 C)  TempSrc: Oral Oral Oral Oral  SpO2: 99% 100% 100% 97%  Weight:      Height:        Intake/Output Summary (Last 24 hours) at 07/05/2017 1839 Last data filed at 07/05/2017 1800 Gross per 24 hour  Intake 58 ml  Output -  Net 58 ml   Filed Weights   07/03/17 0506  Weight: 61.3 kg (135 lb 1.6 oz)    Exam:   General: Alert, awake, oriented x3  Cardiovascular: S1-S2 present, no added heart sounds  Respiratory: Chest clear bilaterally  Abdomen: Soft, nontender, nondistended, positive bowel sounds  Musculoskeletal: No bilateral pedal edema noted  Skin: Normal  Psychiatry: Normal mood  Neuro: No focal neurologic deficit, only reduced vision of the left eye   Data Reviewed: CBC: Recent Labs  Lab 07/02/17 1834  WBC 6.8  NEUTROABS 3.8  HGB 13.2  HCT 39.3  MCV 95.6  PLT 185   Basic Metabolic Panel: Recent Labs  Lab 07/02/17 1834  NA 136  K 4.0  CL 104  CO2 25  GLUCOSE 115*  BUN <5*  CREATININE 0.79  CALCIUM 9.0   GFR: Estimated Creatinine Clearance: 96.8 mL/min (by C-G formula based on SCr of 0.79 mg/dL). Liver Function Tests: No results for input(s):  AST, ALT, ALKPHOS, BILITOT, PROT, ALBUMIN in the last 168 hours. No results for input(s): LIPASE, AMYLASE in the last 168 hours. No results for input(s): AMMONIA in the last 168 hours. Coagulation Profile: No results for  input(s): INR, PROTIME in the last 168 hours. Cardiac Enzymes: No results for input(s): CKTOTAL, CKMB, CKMBINDEX, TROPONINI in the last 168 hours. BNP (last 3 results) No results for input(s): PROBNP in the last 8760 hours. HbA1C: No results for input(s): HGBA1C in the last 72 hours. CBG: No results for input(s): GLUCAP in the last 168 hours. Lipid Profile: No results for input(s): CHOL, HDL, LDLCALC, TRIG, CHOLHDL, LDLDIRECT in the last 72 hours. Thyroid Function Tests: No results for input(s): TSH, T4TOTAL, FREET4, T3FREE, THYROIDAB in the last 72 hours. Anemia Panel: No results for input(s): VITAMINB12, FOLATE, FERRITIN, TIBC, IRON, RETICCTPCT in the last 72 hours. Urine analysis: No results found for: COLORURINE, APPEARANCEUR, LABSPEC, PHURINE, GLUCOSEU, HGBUR, BILIRUBINUR, KETONESUR, PROTEINUR, UROBILINOGEN, NITRITE, LEUKOCYTESUR Sepsis Labs: @LABRCNTIP (procalcitonin:4,lacticidven:4)  )No results found for this or any previous visit (from the past 240 hour(s)).    Studies: Mr Cervical Spine W Wo Contrast  Result Date: 07/05/2017 CLINICAL DATA:  Multiple sclerosis and optic neuritis. EXAM: MRI CERVICAL SPINE WITHOUT AND WITH CONTRAST TECHNIQUE: Multiplanar and multiecho pulse sequences of the cervical spine, to include the craniocervical junction and cervicothoracic junction, were obtained without and with intravenous contrast. CONTRAST:  13 mL MultiHance COMPARISON:  MRI brain and orbits 07/03/2017. FINDINGS: Alignment: AP alignment is anatomic. Vertebrae: Marrow signal and vertebral body heights are normal. Cord: Normal signal is present throughout the cervical and upper thoracic spinal cord to the lowest imaged level, T2-3. There is no significant T2 signal change or enhancement. Posterior Fossa, vertebral arteries, paraspinal tissues: The craniocervical junction is within normal limits. Flow is present in the vertebral artery is bilaterally. The visualized intracranial contents are  normal. Disc levels: No significant focal disc protrusion or stenosis is present. IMPRESSION: Negative MRI of the cervical spine. No focal T2 signal change or enhancement to suggest demyelinating disease in cervical spine. Electronically Signed   By: Marin Roberts M.D.   On: 07/05/2017 08:42    Scheduled Meds: . enoxaparin (LOVENOX) injection  40 mg Subcutaneous Q24H    Continuous Infusions: . methylPREDNISolone (SOLU-MEDROL) injection Stopped (07/05/17 2633)     LOS: 2 days     Briant Cedar, MD [facility]   If 7PM-7AM, please contact night-coverage [email or web address] Password [facility] 07/05/2017, 6:39 PM

## 2017-07-06 NOTE — Plan of Care (Signed)
  Progressing Health Behavior/Discharge Planning: Ability to manage health-related needs will improve 07/06/2017 1052 - Progressing by Marice Potter, RN Clinical Measurements: Ability to maintain clinical measurements within normal limits will improve 07/06/2017 1052 - Progressing by Marice Potter, RN Will remain free from infection 07/06/2017 1052 - Progressing by Marice Potter, RN   Completed/Met Nutrition: Adequate nutrition will be maintained 07/06/2017 1052 - Completed/Met by Marice Potter, RN

## 2017-07-06 NOTE — Progress Notes (Signed)
PROGRESS NOTE  Tamara Booth BJY:[id number] DOB: 1986/07/24 DOA: 07/02/2017 PCP: Juluis Rainier, MD  HPI/Recap of past 24 hours: HPI on 07/03/2017 by Dr. Vassie Moselle a 31 y.o.femalewithoutcurrent past medical history who presents with complaints of having a visual field deficitthat has progressively worsened over the last4 days.Patient noted initially noted being ableto see the sapproximately 50% of her upper visual field, but now only reports being able to see about 15% of her upper visual field. Approximately 6 days ago patient noted having headaches and some left eye pain with blurred vision. She was evaluated by anophthalmologist who ultimatelysent to the emergency department for further evaluation for concern for optic neuritis. Patient denies having any focal weakness, nausea, vomiting, fever, chills, gait disturbance. Patient notes no significant family history of multiple sclerosis to her knowledge.  Today, pt reported feeling better overall.  Reports continous slow improvement of vision in the left eye.  Denies any worsening headaches, focal neurologic weakness, nausea/vomiting, fever/chills, dysuria, chest pain, abdominal pain, diarrhea/constipation.  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 Abnormal enhancement of the cisternal segment of the left optic nerve, consistent with optic neuritis. -Neurology consulted and appreciated, recommending 5 days of high-dose steroids- started on 07/03/2017 -MRI C-spine showed Negative MRI of the cervical spine. No focal T2 signal change or enhancement to suggest demyelinating disease in cervical spine  Tobacco  abuse -discussed cessation  Insomnia -continue melatonin  Headache -tylenol ordered    Code Status: Full  Family Communication: Significant other at bedside  Disposition Plan: Discharge once IV steroids is completed on 11/25   Consultants:  Neurology  Procedures:  None  Antimicrobials:  None  DVT prophylaxis: Lovenox   Objective: Vitals:   07/05/17 0548 07/05/17 1428 07/05/17 2139 07/06/17 0538  BP: 102/68 (!) 101/58 106/69 100/64  Pulse: 62 73 (!) 55 (!) 56  Resp: 18 18 18 18   Temp: 98.5 F (36.9 C) 98.7 F (37.1 C) 98.5 F (36.9 C) 98.7 F (37.1 C)  TempSrc: Oral Oral Oral Oral  SpO2: 100% 97% 100% 100%  Weight:      Height:        Intake/Output Summary (Last 24 hours) at 07/06/2017 1327 Last data filed at 07/05/2017 1800 Gross per 24 hour  Intake 0 ml  Output -  Net 0 ml   Filed Weights   07/03/17 0506  Weight: 61.3 kg (135 lb 1.6 oz)    Exam:   General: Alert, awake, oriented x3  Cardiovascular: S1-S2 present, no added heart sounds  Respiratory: Chest clear bilaterally  Abdomen: Soft, nontender, nondistended, positive bowel sounds  Musculoskeletal: No bilateral pedal edema noted  Skin: Normal  Psychiatry: Normal mood  Neuro: No focal neurologic deficit, only reduced vision of the left eye   Data Reviewed: CBC: Recent Labs  Lab 07/02/17 1834  WBC 6.8  NEUTROABS 3.8  HGB 13.2  HCT 39.3  MCV 95.6  PLT 185   Basic Metabolic Panel: Recent Labs  Lab 07/02/17 1834  NA 136  K 4.0  CL 104  CO2 25  GLUCOSE 115*  BUN <5*  CREATININE 0.79  CALCIUM 9.0   GFR: Estimated Creatinine Clearance: 96.8 mL/min (by C-G formula based on SCr of 0.79 mg/dL). Liver Function Tests: No results  for input(s): AST, ALT, ALKPHOS, BILITOT, PROT, ALBUMIN in the last 168 hours. No results for input(s): LIPASE, AMYLASE in the last 168 hours. No results for input(s): AMMONIA in the last 168 hours. Coagulation Profile: No results for  input(s): INR, PROTIME in the last 168 hours. Cardiac Enzymes: No results for input(s): CKTOTAL, CKMB, CKMBINDEX, TROPONINI in the last 168 hours. BNP (last 3 results) No results for input(s): PROBNP in the last 8760 hours. HbA1C: No results for input(s): HGBA1C in the last 72 hours. CBG: No results for input(s): GLUCAP in the last 168 hours. Lipid Profile: No results for input(s): CHOL, HDL, LDLCALC, TRIG, CHOLHDL, LDLDIRECT in the last 72 hours. Thyroid Function Tests: No results for input(s): TSH, T4TOTAL, FREET4, T3FREE, THYROIDAB in the last 72 hours. Anemia Panel: No results for input(s): VITAMINB12, FOLATE, FERRITIN, TIBC, IRON, RETICCTPCT in the last 72 hours. Urine analysis: No results found for: COLORURINE, APPEARANCEUR, LABSPEC, PHURINE, GLUCOSEU, HGBUR, BILIRUBINUR, KETONESUR, PROTEINUR, UROBILINOGEN, NITRITE, LEUKOCYTESUR Sepsis Labs: @LABRCNTIP (procalcitonin:4,lacticidven:4)  )No results found for this or any previous visit (from the past 240 hour(s)).    Studies: No results found.  Scheduled Meds: . enoxaparin (LOVENOX) injection  40 mg Subcutaneous Q24H    Continuous Infusions: . methylPREDNISolone (SOLU-MEDROL) injection Stopped (07/06/17 [id number])     LOS: 3 days     Briant CedarNkeiruka J Ezenduka, MD [facility]   If 7PM-7AM, please contact night-coverage [email or web address] Password [email or web address] 07/06/2017, 1:27 PM

## 2017-07-07 NOTE — Progress Notes (Signed)
Nsg Discharge Note  Admit Date:  07/02/2017 Discharge date: 07/07/2017   Tamara Booth to be D/C'd Home per MD order.  AVS completed.  Copy for chart, and copy for patient signed, and dated. Patient/caregiver able to verbalize understanding.  Discharge Medication: Allergies as of 07/07/2017      Reactions   Vicodin [hydrocodone-acetaminophen] Shortness Of Breath, Itching      Medication List    TAKE these medications   Melatonin 2.5 MG Caps Take 2.5 mg by mouth at bedtime.       Discharge Assessment: Vitals:   07/06/17 2103 07/07/17 0455  BP: 116/73 106/68  Pulse: (!) 55 (!) 57  Resp: 16 16  Temp: 98.4 F (36.9 C) 97.9 F (36.6 C)  SpO2: 100% 100%   Skin clean, dry and intact without evidence of skin break down, no evidence of skin tears noted. IV catheter discontinued intact. Site without signs and symptoms of complications - no redness or edema noted at insertion site, patient denies c/o pain - only slight tenderness at site.  Dressing with slight pressure applied.  D/c Instructions-Education: Discharge instructions given to patient/family with verbalized understanding. D/c education completed with patient/family including follow up instructions, medication list, d/c activities limitations if indicated, with other d/c instructions as indicated by MD - patient able to verbalize understanding, all questions fully answered. Patient instructed to return to ED, call 911, or call MD for any changes in condition.  Patient escorted via WC, and D/C home via private auto.  Camillo FlamingVicki L Davi Kroon, RN 07/07/2017 11:28 AM

## 2017-07-07 NOTE — Progress Notes (Signed)
At this time she has had full 5 doses of steroids. She will need to be seen out patient by neurology --Dr. Epimenio FootSater  (in 2 week)--for further MS assessment. No need for steroid taper. No further recommendations.  Neurology S/O  Felicie Mornavid Smith PA-C [facility] [phone]  M-F  (8:30 am- 4 PM)  07/07/2017, 9:03 AM

## 2017-07-07 NOTE — Discharge Summary (Signed)
Discharge Summary  Tamara Booth XID:[id number] DOB: 12/31/1985  PCP: Juluis Rainier, MD  Admit date: 07/02/2017 Discharge date: 07/07/2017  Time spent: >58mins  Recommendations for Outpatient Follow-up:  1. PCP 2. Neurology  Discharge Diagnoses:  Active Hospital Problems   Diagnosis Date Noted  . 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    Discharge Condition: Stable   Diet recommendation: Heart healthy  Vitals:   07/06/17 2103 07/07/17 0455  BP: 116/73 106/68  Pulse: (!) 55 (!) 57  Resp: 16 16  Temp: 98.4 F (36.9 C) 97.9 F (36.6 C)  SpO2: 100% 100%    History of present illness:  Tamara Booth a 31 y.o.femalewithoutcurrent past medical history who presents with complaints of having a visual field deficitthat has progressively worsened over the last4 days PTA. Patient noted initially noted being ableto see the sapproximately 50% of her upper visual field, but now only reports being able to see about 15% of her upper visual field. Approximately 6 days ag, PTA patient noted having headaches and some left eye pain with blurred vision. She was evaluated by anophthalmologist who ultimatelysent to the emergency department for further evaluation for concern for optic neuritis. Patient denies having any focal weakness, nausea, vomiting, fever, chills, gait disturbance. Patient notes no significant family history of multiple sclerosis to her knowledge.  Today, pt reported feeling better overall.  Reports slow improvement of vision in the left eye. Denies any worsening headaches, focal neurologic weakness, nausea/vomiting, fever/chills, dysuria, chest pain, abdominal pain, diarrhea/constipation.   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 Abnormal enhancement of the cisternal segment of the left optic nerve, consistent with optic neuritis. -Neurology consulted, recommending 5 days of high-dose steroids- started on 07/03/2017-07/07/17 -MRI C-spine showed Negative MRI of the cervical spine. No focal T2 signal change or enhancement to suggest demyelinating disease in cervical spine -Patient advised to make an appointment with Neurologist Dr Epimenio Foot in 2weeks. Pt verbalized understanding  Tobacco abuse -discussed cessation  Insomnia -continue melatoni   Procedures:  None  Consultations:  Neurology  Discharge Exam: BP 106/68 (BP Location: Left Arm)   Pulse (!) 57   Temp 97.9 F (36.6 C) (Oral)   Resp 16   Ht 5\' 8"  (1.727 m)   Wt 61.3 kg (135 lb 1.6 oz)   LMP 06/25/2017   SpO2 100%   BMI 20.54 kg/m   General: AAO X 3, very plesant Cardiovascular: S1-S2 present, no added heart sounds  Respiratory: Chest clear bilaterally Neuro: No focal neurologic deficit, only reduced vision of the L eye  Discharge Instructions You were cared for by a hospitalist during your hospital stay. If you have any questions about your discharge medications or the care you received while you were in the hospital after you are discharged, you can call the unit and asked to speak with the hospitalist on call if the hospitalist that took care of you is not available. Once you are discharged, your primary care physician will handle any further medical issues. Please note that NO REFILLS for any discharge medications will be authorized once you are discharged, as it is imperative that you return to your primary care physician (or  establish a relationship with a primary care physician if you do not have one) for your aftercare needs so that they can reassess your need for  medications and monitor your lab values.  Discharge Instructions    Diet - low sodium heart healthy   Complete by:  As directed    Increase activity slowly   Complete by:  As directed      Allergies as of 07/07/2017      Reactions   Vicodin [hydrocodone-acetaminophen] Shortness Of Breath, Itching      Medication List    TAKE these medications   Melatonin 2.5 MG Caps Take 2.5 mg by mouth at bedtime.      Allergies  Allergen Reactions  . Vicodin [Hydrocodone-Acetaminophen] Shortness Of Breath and Itching   Follow-up Information    Sater, Pearletha Furl, MD. Schedule an appointment as soon as possible for a visit in 2 week(s).   Specialty:  Neurology Contact information: [address] [place] [address] [phone]        Juluis Rainier, MD. Schedule an appointment as soon as possible for a visit in 1 week(s).   Specialty:  Family Medicine Contact information: [address] [place] [address] [phone]            The results of significant diagnostics from this hospitalization (including imaging, microbiology, ancillary and laboratory) are listed below for reference.    Significant Diagnostic Studies: [name] QQ Contrast  Result Date: 07/03/2017 CLINICAL DATA:  Visual field defect. Loss of upper half of the visual field. EXAM: MRI HEAD AND ORBITS WITHOUT AND WITH CONTRAST TECHNIQUE: Multiplanar, multiecho pulse sequences of the brain and surrounding structures were obtained without and with intravenous contrast. Multiplanar, multiecho pulse sequences of the orbits and surrounding structures were obtained including fat saturation techniques, before and after intravenous contrast administration. CONTRAST:  12mL MULTIHANCE GADOBENATE DIMEGLUMINE 529 MG/ML IV SOLN COMPARISON:  None. FINDINGS: MRI HEAD FINDINGS Brain: There is a tiny cyst measuring 11 mm. There is no acute infarct or acute hemorrhage. No mass lesion, hydrocephalus, dural abnormality  or extra-axial collection. There are multiple hyperintense T2-weighted signal lesionswithin the subcortical and deep supratentorial white matter and within the left cerebellum and brainstem. The most of the supratentorial lesions are oriented perpendicularly to the long axis of the lateral ventricles. The lesion in the right frontal operculum shows contrast enhancement. No other contrast-enhancing lesions. Possible diffusion restriction at the lesion of the left middle cerebellar peduncles. No age-advanced or lobar predominant atrophy. No chronic microhemorrhage or superficial siderosis. Vascular: Major intracranial arterial and venous sinus flow voids are preserved. Skull and upper cervical spine: The visualized skull base, calvarium, upper cervical spine and extracranial soft tissues are normal. MRI ORBITS FINDINGS Orbits: --Globes: Normal. --Bony orbit: Normal. --Preseptal soft tissues: Normal. --Intra- and extraconal orbital fat:  Normal. --Optic nerves: There is abnormal contrast enhancement of the cisternal segment of the left optic nerve, proximal to its entry into the optic canal. There is no abnormal enhancement or T2-weighted signal of the intraorbital optic nerve. The right optic nerve is normal. --Lacrimal glands and fossae: Normal. --Extraocular muscles: Normal. The Visualized sinuses:  No fluid levels or advanced mucosal thickening. Soft tissues: Normal. IMPRESSION: 1. Multiple supratentorial and infratentorial white matter lesions, including a contrast-enhancing lesion of the right frontal operculum. The pattern is most consistent with demyelinating disease and both multiple sclerosis and neuromyelitis optica spectrum disorders are possibilities. CSF sampling should be considered. 2. Abnormal enhancement of the cisternal  segment of the left optic nerve, proximal to its entry into the optic canal is also consistent with optic neuritis in the setting of demyelinating disease. 3. No acute ischemia.  Electronically Signed   By: Deatra RobinsonKevin  Herman M.D.   On: 07/03/2017 02:46   Mr Cervical Spine W Wo Contrast  Result Date: 07/05/2017 CLINICAL DATA:  Multiple sclerosis and optic neuritis. EXAM: MRI CERVICAL SPINE WITHOUT AND WITH CONTRAST TECHNIQUE: Multiplanar and multiecho pulse sequences of the cervical spine, to include the craniocervical junction and cervicothoracic junction, were obtained without and with intravenous contrast. CONTRAST:  13 mL MultiHance COMPARISON:  MRI brain and orbits 07/03/2017. FINDINGS: Alignment: AP alignment is anatomic. Vertebrae: Marrow signal and vertebral body heights are normal. Cord: Normal signal is present throughout the cervical and upper thoracic spinal cord to the lowest imaged level, T2-3. There is no significant T2 signal change or enhancement. Posterior Fossa, vertebral arteries, paraspinal tissues: The craniocervical junction is within normal limits. Flow is present in the vertebral artery is bilaterally. The visualized intracranial contents are normal. Disc levels: No significant focal disc protrusion or stenosis is present. IMPRESSION: Negative MRI of the cervical spine. No focal T2 signal change or enhancement to suggest demyelinating disease in cervical spine. Electronically Signed   By: Marin Robertshristopher  Mattern M.D.   On: 07/05/2017 08:42   Mr Rockwell GermanyOrbits W ZOWo Contrast  Result Date: 07/03/2017 CLINICAL DATA:  Visual field defect. Loss of upper half of the visual field. EXAM: MRI HEAD AND ORBITS WITHOUT AND WITH CONTRAST TECHNIQUE: Multiplanar, multiecho pulse sequences of the brain and surrounding structures were obtained without and with intravenous contrast. Multiplanar, multiecho pulse sequences of the orbits and surrounding structures were obtained including fat saturation techniques, before and after intravenous contrast administration. CONTRAST:  12mL MULTIHANCE GADOBENATE DIMEGLUMINE 529 MG/ML IV SOLN COMPARISON:  None. FINDINGS: MRI HEAD FINDINGS Brain: There  is a tiny cyst measuring 11 mm. There is no acute infarct or acute hemorrhage. No mass lesion, hydrocephalus, dural abnormality or extra-axial collection. There are multiple hyperintense T2-weighted signal lesionswithin the subcortical and deep supratentorial white matter and within the left cerebellum and brainstem. The most of the supratentorial lesions are oriented perpendicularly to the long axis of the lateral ventricles. The lesion in the right frontal operculum shows contrast enhancement. No other contrast-enhancing lesions. Possible diffusion restriction at the lesion of the left middle cerebellar peduncles. No age-advanced or lobar predominant atrophy. No chronic microhemorrhage or superficial siderosis. Vascular: Major intracranial arterial and venous sinus flow voids are preserved. Skull and upper cervical spine: The visualized skull base, calvarium, upper cervical spine and extracranial soft tissues are normal. MRI ORBITS FINDINGS Orbits: --Globes: Normal. --Bony orbit: Normal. --Preseptal soft tissues: Normal. --Intra- and extraconal orbital fat:  Normal. --Optic nerves: There is abnormal contrast enhancement of the cisternal segment of the left optic nerve, proximal to its entry into the optic canal. There is no abnormal enhancement or T2-weighted signal of the intraorbital optic nerve. The right optic nerve is normal. --Lacrimal glands and fossae: Normal. --Extraocular muscles: Normal. The Visualized sinuses:  No fluid levels or advanced mucosal thickening. Soft tissues: Normal. IMPRESSION: 1. Multiple supratentorial and infratentorial white matter lesions, including a contrast-enhancing lesion of the right frontal operculum. The pattern is most consistent with demyelinating disease and both multiple sclerosis and neuromyelitis optica spectrum disorders are possibilities. CSF sampling should be considered. 2. Abnormal enhancement of the cisternal segment of the left optic nerve, proximal to its entry  into the optic canal is  also consistent with optic neuritis in the setting of demyelinating disease. 3. No acute ischemia. Electronically Signed   By: Deatra Robinson M.D.   On: 07/03/2017 02:46    Microbiology: No results found for this or any previous visit (from the past 240 hour(s)).   Labs: Basic Metabolic Panel: Recent Labs  Lab 07/02/17 1834  NA 136  K 4.0  CL 104  CO2 25  GLUCOSE 115*  BUN <5*  CREATININE 0.79  CALCIUM 9.0   Liver Function Tests: No results for input(s): AST, ALT, ALKPHOS, BILITOT, PROT, ALBUMIN in the last 168 hours. No results for input(s): LIPASE, AMYLASE in the last 168 hours. No results for input(s): AMMONIA in the last 168 hours. CBC: Recent Labs  Lab 07/02/17 1834  WBC 6.8  NEUTROABS 3.8  HGB 13.2  HCT 39.3  MCV 95.6  PLT 185   Cardiac Enzymes: No results for input(s): CKTOTAL, CKMB, CKMBINDEX, TROPONINI in the last 168 hours. BNP: BNP (last 3 results) No results for input(s): BNP in the last 8760 hours.  ProBNP (last 3 results) No results for input(s): PROBNP in the last 8760 hours.  CBG: No results for input(s): GLUCAP in the last 168 hours.     Signed:  Briant Cedar, MD [facility] 07/07/2017, 10:06 AM

## 2017-07-08 ENCOUNTER — Telehealth: Payer: Self-pay | Admitting: *Deleted

## 2017-07-08 NOTE — Telephone Encounter (Signed)
-----   Message from Asa Lente, MD sent at 07/05/2017  9:44 AM EST ----- Regarding: RE: New MS patient Nita Sells, I will try to get her in next week.   By the MRI appearance and history, she has definite MS.  Thank you Renford Dills,  Please check the schedule to see if we can get her in next week. (11/28 pm, if not maybe squeeze in 11/26 pm)   Thank you  Richard   ----- Message ----- From: Edsel Petrin, DO Sent: 07/04/2017   1:32 PM To: Asa Lente, MD Subject: New MS patient                                 Hi Dr. Epimenio Foot- Can Kamillah Devivo be worked into your schedule? She presented with L visual deficits and MRI shows optic neuritis/MS, new diagnosis.She has been placed on 5 days of IV solumedrol.   Thank you, Edsel Petrin, DO [facility]

## 2017-07-08 NOTE — Telephone Encounter (Signed)
Spoke with Krisztina this am and gave appt. with RAS 07/10/17 at 1330, arrival time of 1300.  Office address, directions given/fim

## 2017-07-10 ENCOUNTER — Other Ambulatory Visit: Payer: Self-pay

## 2017-07-10 ENCOUNTER — Encounter: Payer: Self-pay | Admitting: Neurology

## 2017-07-10 ENCOUNTER — Ambulatory Visit (INDEPENDENT_AMBULATORY_CARE_PROVIDER_SITE_OTHER): Payer: No Typology Code available for payment source | Admitting: Neurology

## 2017-07-10 ENCOUNTER — Telehealth: Payer: Self-pay | Admitting: *Deleted

## 2017-07-10 VITALS — BP 111/70 | HR 86 | Resp 14 | Ht 68.0 in | Wt 142.5 lb

## 2017-07-10 DIAGNOSIS — H469 Unspecified optic neuritis: Secondary | ICD-10-CM

## 2017-07-10 DIAGNOSIS — G35 Multiple sclerosis: Secondary | ICD-10-CM | POA: Diagnosis not present

## 2017-07-10 NOTE — Telephone Encounter (Signed)
Per pt's ok, referral to [employer] for educational, newly dx. info, support group info faxed to [employer] fax# [phone]/fim

## 2017-07-10 NOTE — Progress Notes (Signed)
[facility]  PATIENT: Tamara Booth DOB: 25-Aug-1985  REFERRING DOCTOR OR PCP:  Juluis Rainier SOURCE: Patient, hospital records, imaging and lab reports, MRI images on PACS  _________________________________   HISTORICAL  CHIEF COMPLAINT:  Chief Complaint  Patient presents with  . Decreased Visual Acuity    Tamara Booth is here with her husband Tamara Booth to discuss possible MS dx.  Sts. she woke up on 06/28/17 feeling as if she had someting in her left eye.  Vision progressively worsened over the next couple of days.  Seen at [facility] and dx. with optic neuritis.  5 days of IV SM given.  MRI with changes consistent with MS.  Visual acuity today OD 20/30 with glasses. OS--unable to see anything./fim     HISTORY OF PRESENT ILLNESS:  I had the pleasure seeing you patient, Tamara Booth, at [facility] for neurologic consultation regarding her recent diagnosis of MS following an episode of left optic neuritis.  On 06/28/2017, she felt that there was something in her left eye when she woke up. As the day went on she began to note more difficulty with her vision and this worsened further over the next couple days until she was unable to see. A couple days later she presented to the [facility] MRI was consistent with left optic neuritis and also showed other changes consistent with MS. She received 5 days of IV Solu-Medrol.   Her last day of steroid was 07/07/2017.   Over the past couple days she is noting that she is able to see a little vision on the top and bottom of the left visual field nothing in the middle except for bright lights.   She cannot see any colors out of that eye. She denies other neurologic symptoms. Specifically, she has not noted any problems with gait, balance, strength, sensation, or bladder function. She notes some fatigue but feels that she does about the same as other people she knows. She sometimes has insomnia and takes melatonin when  necessary. She denies any problems with cognition or mood.  Her brother has alopecia.  No FH of MS   I personally reviewed the MRI of the brain performed 07/03/2017. It shows an enhancing lesion in the left optic nerve just distal to the chiasm. There are multiple T2/FLAIR hyperintense foci in the periventricular, juxtacortical deep white matter of the hemispheres. There are also foci in the pons, left middle cerebellar peduncle, left cerebellar hemisphere and left thalamus  Two of the lesions enhance,  one in the right juxtacortical white matter and another in the left thalamus.  There is a subtle focus at the cervicomedullary junction on the sagittal FLAIR images of the brain that is not seen on the T2-weighted images of the cervical spine performed last week.   REVIEW OF SYSTEMS: Constitutional: No fevers, chills, sweats, or change in appetite Eyes: No visual changes, double vision, eye pain Ear, nose and throat: No hearing loss, ear pain, nasal congestion, sore throat Cardiovascular: No chest pain, palpitations Respiratory: No shortness of breath at rest or with exertion.   No wheezes GastrointestinaI: No nausea, vomiting, diarrhea, abdominal pain, fecal incontinence Genitourinary: No dysuria, urinary retention or frequency.  No nocturia. Musculoskeletal: No neck pain, back pain Integumentary: No rash, pruritus, skin lesions Neurological: as above Psychiatric: No depression at this time.  No anxiety Endocrine: No palpitations, diaphoresis, change in appetite, change in weigh or increased thirst Hematologic/Lymphatic: No anemia, purpura, petechiae. Allergic/Immunologic: No itchy/runny eyes, nasal congestion,  recent allergic reactions, rashes  ALLERGIES: Allergies  Allergen Reactions  . Vicodin [Hydrocodone-Acetaminophen] Shortness Of Breath and Itching    HOME MEDICATIONS:  Current Outpatient Medications:  Marland Kitchen  Melatonin 2.5 MG CAPS, Take 2.5 mg by mouth at bedtime., Disp: , Rfl:     PAST MEDICAL HISTORY: Past Medical History:  Diagnosis Date  . Optic neuritis 07/03/2017    PAST SURGICAL HISTORY: Past Surgical History:  Procedure Laterality Date  . ABDOMINAL HERNIA REPAIR     at the age of 39  . WISDOM TOOTH EXTRACTION      FAMILY HISTORY: Family History  Problem Relation Age of Onset  . Pulmonary fibrosis Father   . Alopecia Brother     SOCIAL HISTORY:  Social History   Socioeconomic History  . Marital status: Married    Spouse name: Not on file  . Number of children: Not on file  . Years of education: Not on file  . Highest education level: Not on file  Social Needs  . Financial resource strain: Not on file  . Food insecurity - worry: Not on file  . Food insecurity - inability: Not on file  . Transportation needs - medical: Not on file  . Transportation needs - non-medical: Not on file  Occupational History  . Not on file  Tobacco Use  . Smoking status: Current Every Day Smoker    Packs/day: 0.75    Years: 16.00    Pack years: 12.00    Types: Cigarettes  . Smokeless tobacco: Never Used  Substance and Sexual Activity  . Alcohol use: No    Frequency: Never  . Drug use: No  . Sexual activity: No  Other Topics Concern  . Not on file  Social History Narrative  . Not on file     PHYSICAL EXAM  Vitals:   07/10/17 1314  BP: 111/70  Pulse: 86  Resp: 14  Weight: 142 lb 8 oz (64.6 kg)  Height: 5\' 8"  (1.727 m)    Body mass index is 21.67 kg/m.   General: The patient is well-developed and well-nourished and in no acute distress  Eyes:  Funduscopic exam shows normal optic discs and retinal vessels.   Visual acuity is 20/30 on the right and light perception to hand waving on the left.  Neck: The neck is supple, no carotid bruits are noted.  The neck is nontender.  Cardiovascular: The heart has a regular rate and rhythm with a normal S1 and S2. There were no murmurs, gallops or rubs. Lungs are clear to auscultation.  Skin:  Extremities are without significant edema.  Musculoskeletal:  Back is nontender  Neurologic Exam  Mental status: The patient is alert and oriented x 3 at the time of the examination. The patient has apparent normal recent and remote memory, with an apparently normal attention span and concentration ability.   Speech is normal.  Cranial nerves: Extraocular movements are full. She has a 3+ left APD.   Facial symmetry is present. There is good facial sensation to soft touch bilaterally.Facial strength is normal.  Trapezius and sternocleidomastoid strength is normal. No dysarthria is noted.  The tongue is midline, and the patient has symmetric elevation of the soft palate. No obvious hearing deficits are noted.  Motor:  Muscle bulk is normal.   Tone is normal. Strength is  5 / 5 in all 4 extremities.   Sensory: Sensory testing is intact to pinprick, soft touch and vibration sensation in all 4 extremities except reduced  vibrion oin the left leg.     Coordination: Cerebellar testing reveals good finger-nose-finger and heel-to-shin bilaterally.  Gait and station: Station is normal.   Gait is normal. Tandem gait is mildly wide. Romberg is negative.   Reflexes: Deep tendon reflexes are symmetric and normal bilaterally.   Plantar responses are flexor.    DIAGNOSTIC DATA (LABS, IMAGING, TESTING) - I reviewed patient records, labs, notes, testing and imaging myself where available.  Lab Results  Component Value Date   WBC 6.8 07/02/2017   HGB 13.2 07/02/2017   HCT 39.3 07/02/2017   MCV 95.6 07/02/2017   PLT 185 07/02/2017      Component Value Date/Time   NA 136 07/02/2017 1834   K 4.0 07/02/2017 1834   CL 104 07/02/2017 1834   CO2 25 07/02/2017 1834   GLUCOSE 115 (H) 07/02/2017 1834   BUN <5 (L) 07/02/2017 1834   CREATININE 0.79 07/02/2017 1834   CALCIUM 9.0 07/02/2017 1834   GFRNONAA >60 07/02/2017 1834   GFRAA >60 07/02/2017 1834       ASSESSMENT AND PLAN  Optic neuritis due  to multiple sclerosis (HCC)   In summary, Jeanine LuzJami Anglada is a 31 year old woman with a new diagnosis of multiple sclerosis after presenting with left optic neuritis this month. She received 5 days of IV Solu-Medrol and she notes that her vision is starting to improve though is still very poor on the left. I went over some of the symptoms that could occur with MS and showed her and her husband the MRI images.    I would like to get her started on a disease modifying therapy and we discussed options in detail. She is most interested in one of the pills. We discussed Tecfidera, Gilenya and a drug related to Tecfidera that is in a clinical study. She was most interested in the clinical study and I introduced her to our research staff. She would need to be on a highly effective birth control method to be in the study and she plans on discussing this with her gynecologist.  If she chooses not to go on a birth control, then I will get her started on Tecfidera.    She will return to see me sometime in the next month if she goes into the drug study or in a few months otherwise (we'll be in contact with her either way in the next couple weeks). She is also advised to call if she has any new or worsening neurologic symptoms  Thank you for asking me to see Avryl. Please let me know if I can be of further assistance with her or other patients in the future.   Napolean Sia A. Epimenio FootSater, MD, PhD, Larene BeachFAAN 07/10/2017, 2:42 PM Certified in Neurology, Clinical Neurophysiology, Sleep Medicine, Pain Medicine and Neuroimaging  [facility] [address], [address] [place], [place] [id number] [phone]

## 2017-08-14 ENCOUNTER — Ambulatory Visit: Payer: Self-pay | Admitting: Neurology

## 2017-08-30 ENCOUNTER — Other Ambulatory Visit: Payer: Self-pay | Admitting: Neurology

## 2017-08-30 DIAGNOSIS — G35 Multiple sclerosis: Secondary | ICD-10-CM

## 2017-09-10 ENCOUNTER — Other Ambulatory Visit: Payer: No Typology Code available for payment source

## 2017-09-10 ENCOUNTER — Ambulatory Visit (INDEPENDENT_AMBULATORY_CARE_PROVIDER_SITE_OTHER): Payer: No Typology Code available for payment source | Admitting: Neurology

## 2017-09-10 DIAGNOSIS — Z0289 Encounter for other administrative examinations: Secondary | ICD-10-CM

## 2017-09-12 ENCOUNTER — Ambulatory Visit
Admission: RE | Admit: 2017-09-12 | Discharge: 2017-09-12 | Disposition: A | Discharge status: Home / Self Care | Payer: No Typology Code available for payment source | Source: Ambulatory Visit | Attending: Neurology | Admitting: Neurology

## 2017-09-12 DIAGNOSIS — G35 Multiple sclerosis: Secondary | ICD-10-CM

## 2017-09-17 ENCOUNTER — Ambulatory Visit: Payer: Self-pay | Admitting: Neurology

## 2017-10-08 ENCOUNTER — Telehealth: Payer: Self-pay | Admitting: Neurology

## 2017-10-08 ENCOUNTER — Other Ambulatory Visit: Payer: Self-pay | Admitting: Neurology

## 2017-10-08 MED ORDER — ARMODAFINIL 200 MG PO TABS: ORAL_TABLET | ORAL | 5 refills | Status: DC

## 2017-10-08 NOTE — Telephone Encounter (Signed)
She came in today for research visit. She reports having more fatigue over the last week. She also is excessively sleepy and goes back to bed after she gets her child ready for school.  She is tolerating the study drug better as far as flushing.    She notes no new neurologic symptoms.  I will send in a prescription for Nuvigil 200 mg, one half to one every morning

## 2017-10-16 ENCOUNTER — Ambulatory Visit: Payer: No Typology Code available for payment source | Admitting: Neurology

## 2017-10-22 ENCOUNTER — Ambulatory Visit: Payer: No Typology Code available for payment source | Admitting: Neurology

## 2017-10-22 ENCOUNTER — Other Ambulatory Visit: Payer: Self-pay | Admitting: Neurology

## 2017-10-22 DIAGNOSIS — G35 Multiple sclerosis: Secondary | ICD-10-CM

## 2017-10-23 ENCOUNTER — Encounter: Payer: Self-pay | Admitting: Neurology

## 2017-10-30 ENCOUNTER — Ambulatory Visit
Admission: RE | Admit: 2017-10-30 | Discharge: 2017-10-30 | Disposition: A | Discharge status: Home / Self Care | Payer: No Typology Code available for payment source | Source: Ambulatory Visit | Attending: Neurology | Admitting: Neurology

## 2017-10-30 ENCOUNTER — Other Ambulatory Visit: Payer: Self-pay

## 2017-10-30 DIAGNOSIS — G35 Multiple sclerosis: Secondary | ICD-10-CM

## 2017-10-31 ENCOUNTER — Telehealth: Payer: Self-pay | Admitting: *Deleted

## 2017-10-31 NOTE — Telephone Encounter (Signed)
-----   Message from Asa Lente, MD sent at 10/30/2017  7:00 PM EDT ----- Regarding: MRI result  Please let her know that the MRI of the brain did not show any new MS lesions

## 2017-10-31 NOTE — Telephone Encounter (Signed)
LMOM with below MRI report.  She does not need to return this call unless she has questions/fim 

## 2017-11-12 ENCOUNTER — Ambulatory Visit: Payer: Self-pay | Admitting: Neurology

## 2017-11-26 ENCOUNTER — Ambulatory Visit: Payer: Self-pay | Admitting: Neurology

## 2017-12-24 ENCOUNTER — Ambulatory Visit: Payer: Self-pay | Admitting: Neurology

## 2017-12-27 ENCOUNTER — Ambulatory Visit: Payer: Self-pay | Admitting: Neurology

## 2018-01-09 ENCOUNTER — Other Ambulatory Visit: Payer: Self-pay | Admitting: Radiology

## 2018-01-09 DIAGNOSIS — N6310 Unspecified lump in the right breast, unspecified quadrant: Secondary | ICD-10-CM

## 2018-01-14 ENCOUNTER — Other Ambulatory Visit: Payer: No Typology Code available for payment source

## 2018-01-21 ENCOUNTER — Ambulatory Visit: Payer: Self-pay | Admitting: Neurology

## 2018-02-18 ENCOUNTER — Ambulatory Visit: Payer: Self-pay | Admitting: Neurology

## 2018-03-18 ENCOUNTER — Ambulatory Visit: Payer: Self-pay | Admitting: Neurology

## 2018-04-15 ENCOUNTER — Ambulatory Visit: Payer: Self-pay | Admitting: Neurology

## 2018-07-01 ENCOUNTER — Ambulatory Visit: Payer: Self-pay | Admitting: Neurology

## 2018-08-18 ENCOUNTER — Other Ambulatory Visit: Payer: Self-pay | Admitting: *Deleted

## 2018-08-18 DIAGNOSIS — G35 Multiple sclerosis: Secondary | ICD-10-CM

## 2018-09-22 ENCOUNTER — Other Ambulatory Visit: Payer: Self-pay | Admitting: Neurology

## 2018-09-22 ENCOUNTER — Ambulatory Visit
Admission: RE | Admit: 2018-09-22 | Discharge: 2018-09-22 | Disposition: A | Discharge status: Home / Self Care | Payer: No Typology Code available for payment source | Source: Ambulatory Visit | Attending: Neurology | Admitting: Neurology

## 2018-09-22 ENCOUNTER — Ambulatory Visit: Payer: Self-pay | Admitting: Neurology

## 2018-09-22 DIAGNOSIS — G35 Multiple sclerosis: Secondary | ICD-10-CM

## 2018-09-22 MED ORDER — AMPHETAMINE-DEXTROAMPHET ER 15 MG PO CP24: 15.0000 mg | ORAL_CAPSULE | ORAL | 0 refills | Status: DC

## 2018-09-22 MED ORDER — GADOTERIDOL 279.3 MG/ML IV SOLN
12.0000 mL | Freq: Once | INTRAVENOUS | Status: AC | PRN
  Administered 2018-09-22: 12 mL via INTRAVENOUS

## 2018-09-23 ENCOUNTER — Telehealth: Payer: Self-pay | Admitting: *Deleted

## 2018-09-23 NOTE — Telephone Encounter (Signed)
Called and spoke with pt. Advised MRI looks good and no new lesions. Pt verbalized understanding and appreciation.

## 2018-09-23 NOTE — Telephone Encounter (Signed)
-----   Message from Asa Lente, MD sent at 09/22/2018  7:02 PM EST ----- Please let her know the MRI looks good.  There are no new lesions.

## 2018-10-21 ENCOUNTER — Other Ambulatory Visit: Payer: Self-pay | Admitting: Neurology

## 2018-10-21 MED ORDER — AMPHETAMINE-DEXTROAMPHET ER 15 MG PO CP24: 15.0000 mg | ORAL_CAPSULE | ORAL | 0 refills | Status: DC

## 2018-10-21 NOTE — Telephone Encounter (Signed)
Patient called in to refill her amphetamine-dextroamphetamine (ADDERALL XR) 15 MG 24 hr capsule sent to  [id number] - [place], [place] - [address] AT [facility] [address] [phone] (Phone) [phone] (Fax)

## 2018-11-20 ENCOUNTER — Other Ambulatory Visit: Payer: Self-pay | Admitting: Neurology

## 2018-11-20 MED ORDER — AMPHETAMINE-DEXTROAMPHET ER 15 MG PO CP24: 15.0000 mg | ORAL_CAPSULE | ORAL | 0 refills | Status: DC

## 2018-11-20 NOTE — Telephone Encounter (Signed)
Pt is needing a refill on her amphetamine-dextroamphetamine (ADDERALL XR) 15 MG 24 hr capsule sent to the [facility] on [address]

## 2018-11-20 NOTE — Telephone Encounter (Addendum)
I checked pt chart. She was last seen for OV back in 2018. Appears she was part of a research study here at [facility]. I will check with research department. They confirmed she is still in research study. Per Jamil: "She was here 09/22/18 and now scheduled for 12/418 @9 :00AM".  I checked drug registry. She last refilled rx adderall XR 15mg  on 10/22/18 #30 at [facility].

## 2018-12-15 ENCOUNTER — Other Ambulatory Visit (INDEPENDENT_AMBULATORY_CARE_PROVIDER_SITE_OTHER): Payer: Self-pay

## 2018-12-15 ENCOUNTER — Other Ambulatory Visit: Payer: Self-pay

## 2018-12-15 ENCOUNTER — Other Ambulatory Visit: Payer: Self-pay | Admitting: Neurology

## 2018-12-15 DIAGNOSIS — M2540 Effusion, unspecified joint: Secondary | ICD-10-CM

## 2018-12-15 DIAGNOSIS — Z0289 Encounter for other administrative examinations: Secondary | ICD-10-CM

## 2018-12-15 NOTE — Progress Notes (Signed)
She is here for a visit for the [employer] research study and notes joint swelling in both hands (not elsewhere).  I will check labs for inflammatory etiology.

## 2018-12-18 ENCOUNTER — Telehealth: Payer: Self-pay | Admitting: *Deleted

## 2018-12-18 DIAGNOSIS — A6920 Lyme disease, unspecified: Secondary | ICD-10-CM

## 2018-12-18 LAB — LYME, WESTERN BLOT, SERUM (REFLEXED)
IgG P28 Ab.: ABSENT
IgG P30 Ab.: ABSENT
IgG P45 Ab.: ABSENT
IgG P58 Ab.: ABSENT
IgG P66 Ab.: ABSENT
IgM P39 Ab.: ABSENT
IgM P41 Ab.: ABSENT
Lyme IgG Wb: POSITIVE — AB
Lyme IgM Wb: NEGATIVE

## 2018-12-18 LAB — RHEUMATOID FACTOR: Rhuematoid fact SerPl-aCnc: <10 IU/mL (ref 0.0–13.9)

## 2018-12-18 LAB — ANA W/REFLEX: Anti Nuclear Antibody (ANA): NEGATIVE

## 2018-12-18 LAB — URIC ACID: Uric Acid: 3.5 mg/dL (ref 2.5–7.1)

## 2018-12-18 LAB — SEDIMENTATION RATE: Sed Rate: 2 mm/hr (ref 0–32)

## 2018-12-18 LAB — B. BURGDORFI ANTIBODIES: Lyme IgG/IgM Ab: 3.33 {ISR} — ABNORMAL HIGH (ref 0.00–0.90)

## 2018-12-18 MED ORDER — DOXYCYCLINE HYCLATE 100 MG PO TABS: ORAL_TABLET | ORAL | 0 refills | Status: DC

## 2018-12-18 NOTE — Telephone Encounter (Signed)
-----   Message from Asa Lente, MD sent at 12/18/2018  9:05 AM EDT ----- Please let her know the labs came back --- The Lyme test (including the more accurate Western Blot) were positive.   Therefore she should take 21 days of doxycycline (100 mg po bid  #42  #0).     Other tests (for lupus and rheumatoid arthritis were negative)

## 2018-12-18 NOTE — Telephone Encounter (Signed)
Called and spoke with pt. Relayed results per Dr. Bonnita Hollow note. E-scribed rx. Verified pharmacy correct that was on file. Asked her to call back if she has further questions/concerns. She verbalized understanding.

## 2018-12-19 ENCOUNTER — Other Ambulatory Visit: Payer: Self-pay | Admitting: Neurology

## 2018-12-19 NOTE — Telephone Encounter (Signed)
Pt has called for a refill on her amphetamine-dextroamphetamine (ADDERALL XR) 15 MG 24 hr capsule [id number] Pt aware office is not open on Friday and to check with her pharmacy on Monday

## 2018-12-22 ENCOUNTER — Telehealth: Payer: Self-pay | Admitting: Neurology

## 2018-12-22 MED ORDER — AMPHETAMINE-DEXTROAMPHET ER 15 MG PO CP24: 15.0000 mg | ORAL_CAPSULE | ORAL | 0 refills | Status: DC

## 2018-12-22 NOTE — Telephone Encounter (Signed)
Pt is needing a refill on her amphetamine-dextroamphetamine (ADDERALL XR) 15 MG 24 hr capsule sent to [facility] on [address].

## 2018-12-22 NOTE — Telephone Encounter (Signed)
We received same request on Friday 12/19/18. I sent refill request already to Dr. Epimenio Foot this am. Pending approval.

## 2018-12-22 NOTE — Addendum Note (Signed)
Addended by: Hillis Range on: 12/22/2018 08:04 AM   Modules accepted: Orders

## 2019-01-19 ENCOUNTER — Other Ambulatory Visit: Payer: Self-pay | Admitting: Neurology

## 2019-01-19 MED ORDER — AMPHETAMINE-DEXTROAMPHET ER 15 MG PO CP24: 15.0000 mg | ORAL_CAPSULE | ORAL | 0 refills | Status: DC

## 2019-01-19 NOTE — Telephone Encounter (Signed)
Pt has called for a refill on her amphetamine-dextroamphetamine (ADDERALL XR) 15 MG 24 hr capsule [facility] [phone]

## 2019-01-19 NOTE — Addendum Note (Signed)
Addended by: Hope Pigeon on: 01/19/2019 08:43 AM   Modules accepted: Orders

## 2019-01-19 NOTE — Telephone Encounter (Signed)
Reviewed pt chart. She is in [place] research study here at [facility]. I checked drug registry. She last refilled rx 12/22/18 #30.

## 2019-02-18 ENCOUNTER — Other Ambulatory Visit: Payer: Self-pay | Admitting: Neurology

## 2019-02-18 MED ORDER — AMPHETAMINE-DEXTROAMPHET ER 15 MG PO CP24: 15.0000 mg | ORAL_CAPSULE | ORAL | 0 refills | Status: DC

## 2019-02-18 NOTE — Telephone Encounter (Signed)
Patient called and requested a refill on rx Aderrall. DW

## 2019-02-18 NOTE — Telephone Encounter (Signed)
She is in [place] research study here at [facility]. I checked drug registry. She last refilled rx 01/21/19 #30

## 2019-03-23 ENCOUNTER — Other Ambulatory Visit: Payer: Self-pay | Admitting: Neurology

## 2019-03-23 MED ORDER — AMPHETAMINE-DEXTROAMPHET ER 15 MG PO CP24: 15.0000 mg | ORAL_CAPSULE | ORAL | 0 refills | Status: DC

## 2019-03-23 NOTE — Telephone Encounter (Signed)
Checked drug registry. She last refilled 02/20/2019 #30.

## 2019-03-23 NOTE — Telephone Encounter (Signed)
Pt is needing a refill on her amphetamine-dextroamphetamine (ADDERALL XR) 15 MG 24 hr capsule sent to the [facility] on [address]

## 2019-04-22 ENCOUNTER — Other Ambulatory Visit: Payer: Self-pay | Admitting: Neurology

## 2019-04-22 MED ORDER — AMPHETAMINE-DEXTROAMPHET ER 15 MG PO CP24: 15.0000 mg | ORAL_CAPSULE | ORAL | 0 refills | Status: DC

## 2019-04-22 NOTE — Telephone Encounter (Signed)
Pt is needing a refill on her amphetamine-dextroamphetamine (ADDERALL XR) 15 MG 24 hr capsule sent to the [facility] on [address]

## 2019-04-28 IMAGING — MR MR ORBITS WO/W CM
7 of 17 series · 25 of 48 positions shown · IV contrast (multihance)
Comparison: None.

CLINICAL DATA: Visual field defect. Loss of upper half of the
visual field.

EXAM:
MRI HEAD AND ORBITS WITHOUT AND WITH CONTRAST
TECHNIQUE: Multiplanar, multiecho pulse sequences of the brain and surrounding
structures were obtained without and with intravenous contrast.
Multiplanar, multiecho pulse sequences of the orbits and surrounding
structures were obtained including fat saturation techniques, before
and after intravenous contrast administration.
CONTRAST:  12mL MULTIHANCE GADOBENATE DIMEGLUMINE 529 MG/ML IV SOLN

[Series 2: FLAIR · sagittal · 5.0mm · 0.47mm/px · 2 of 24 slices shown (1 of 3)]
[im 1/24]
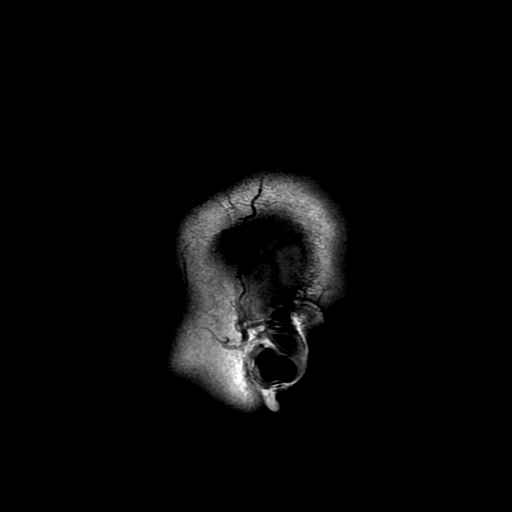
[im 24/24]
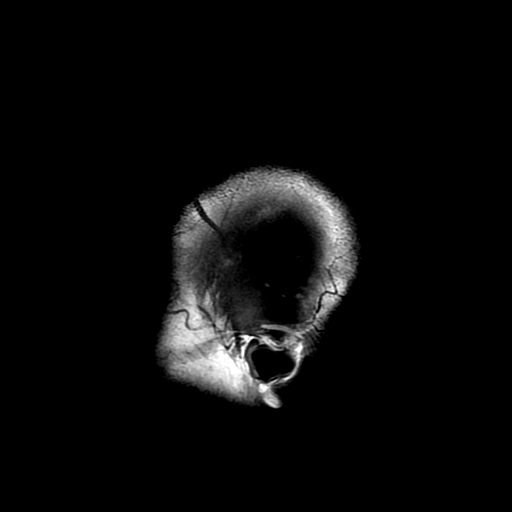

[Series 4: DWI · axial · 3.0mm · 0.94mm/px · z∈[-63,+81]mm · 7 of 100 slices shown]
[im 1/100]
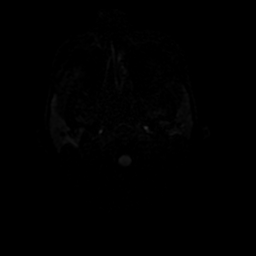
[im 17/100]
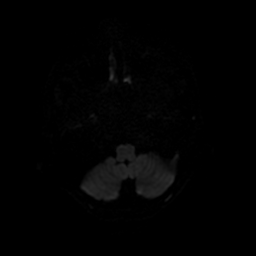
[im 34/100]
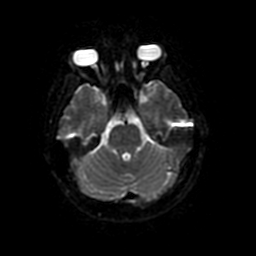
[im 50/100]
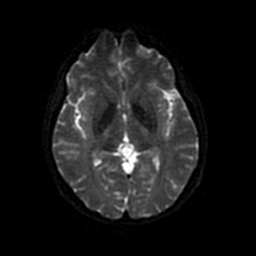
[im 67/100]
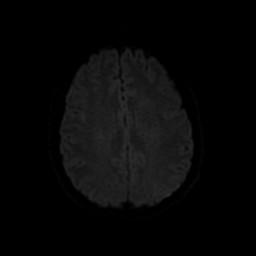
[im 83/100]
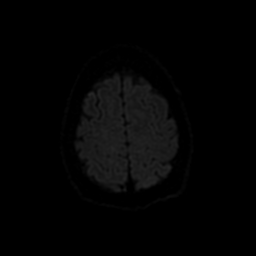
[im 100/100]
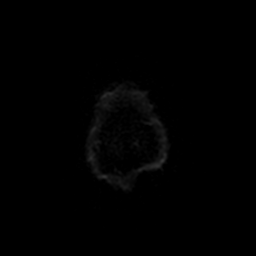

[Series 5: T2 · axial · 5.0mm · 0.43mm/px · z∈[-59,+82]mm · 2 of 25 slices shown]
[im 1/25]
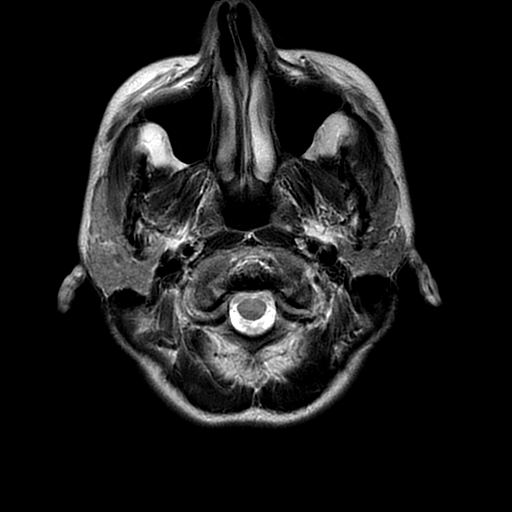
[im 25/25]
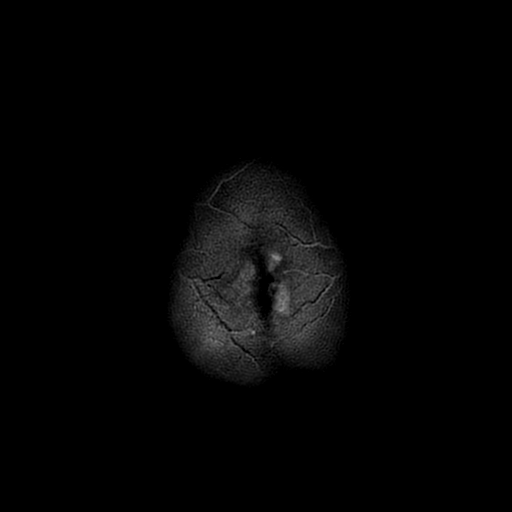

[Series 6: FLAIR · axial · 3.0mm · 0.43mm/px · 1 of 25 slices shown (2 of 3)]
[im 1/25]
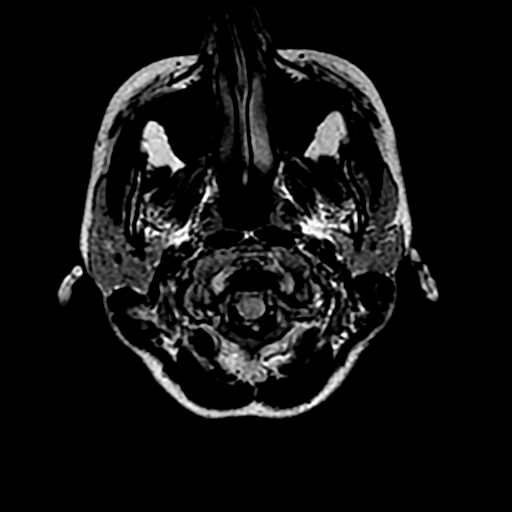

[Series 9: T2 fat-sat · coronal · 4.0mm · 0.35mm/px · 1 of 25 slices shown]
[im 1/25]
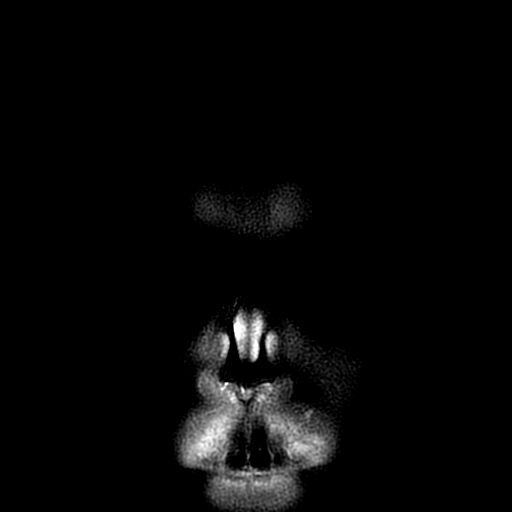

[Series 13: FLAIR · sagittal · 1.6mm · 0.49mm/px · 9 of 208 slices shown (3 of 3)]
[im 1/208]
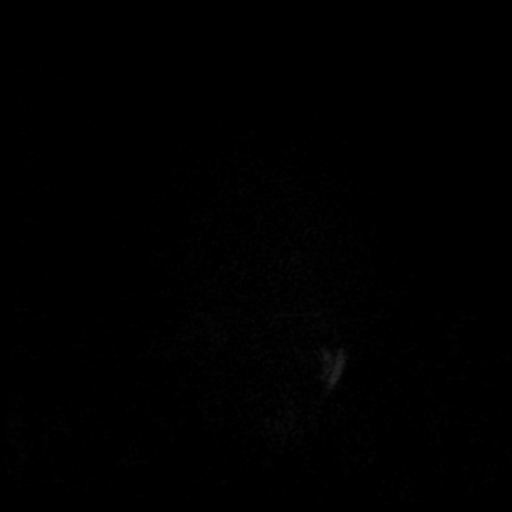
[im 38/208]
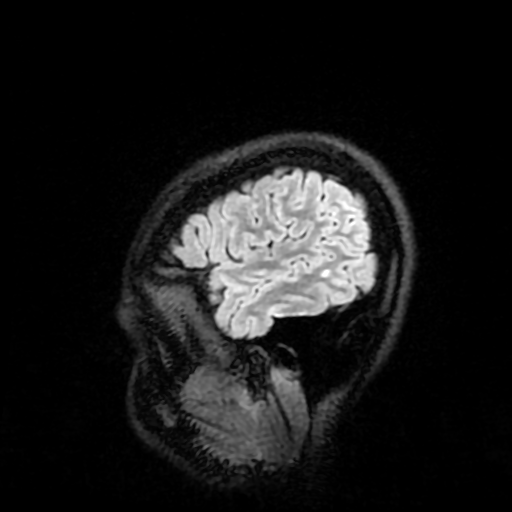
[im 57/208]
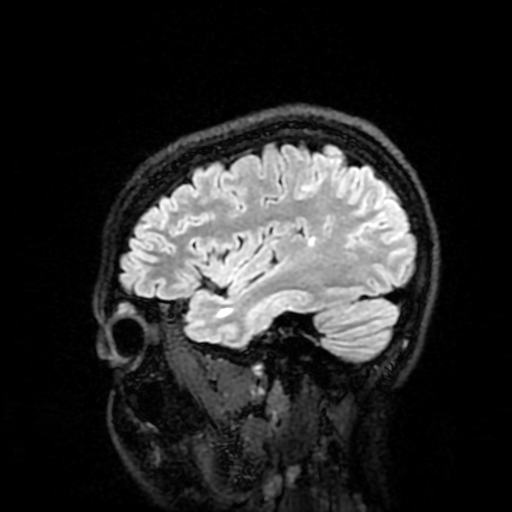
[im 95/208]
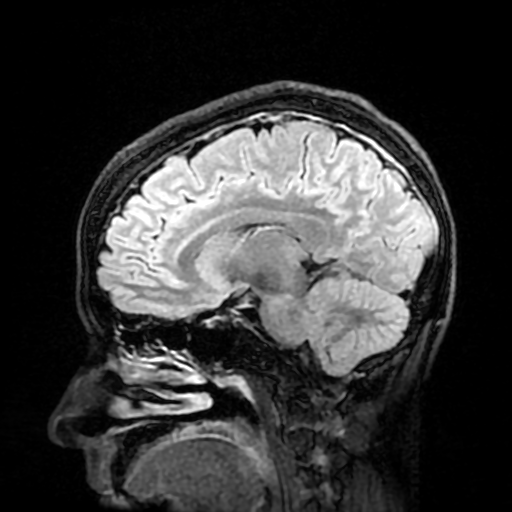
[im 113/208]
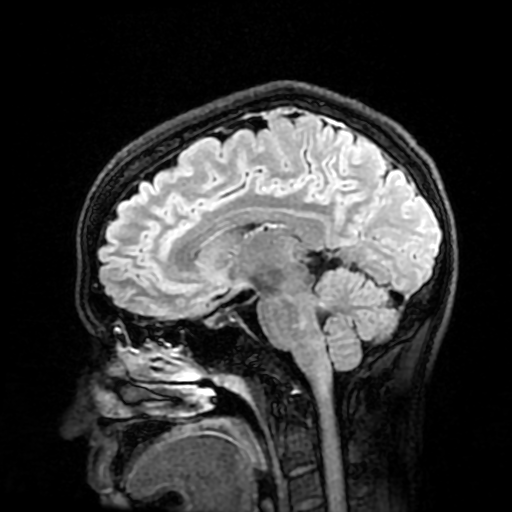
[im 151/208]
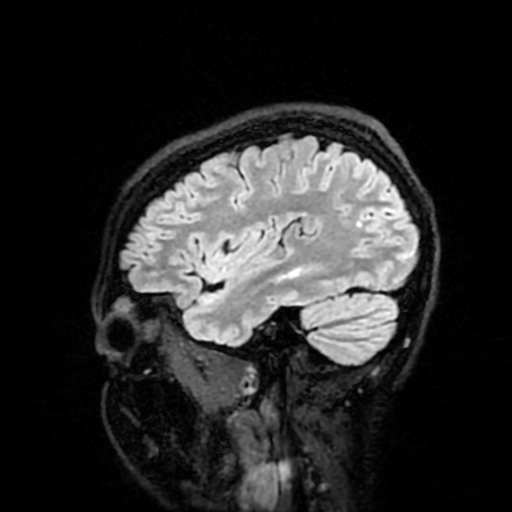
[im 170/208]
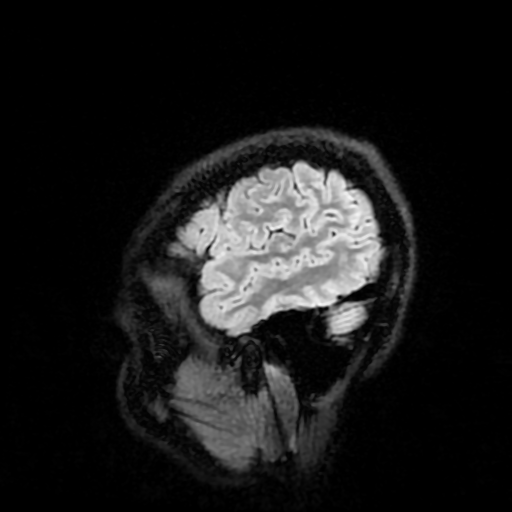
[im 189/208]
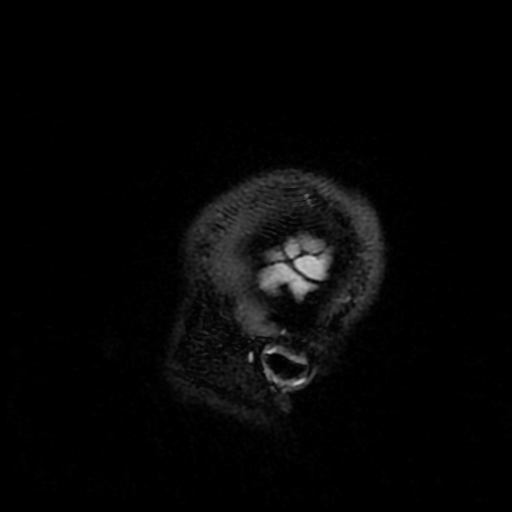
[im 208/208]
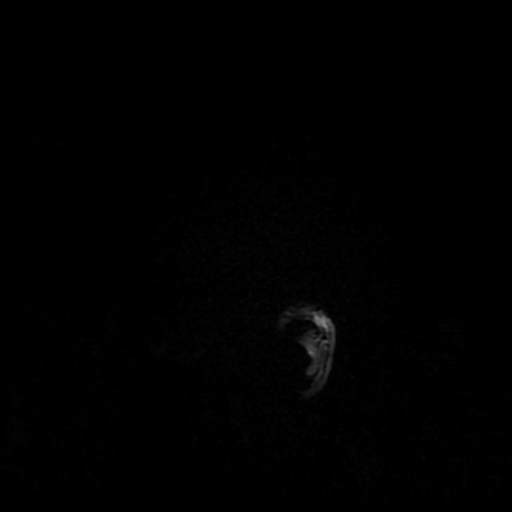

[Series 450: ADC · axial · 3.0mm · 0.94mm/px · z∈[-63,+81]mm · 3 of 50 slices shown]
[im 1/50]
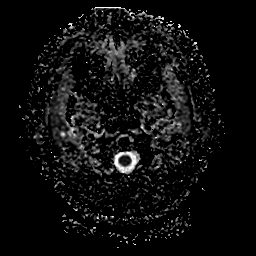
[im 25/50]
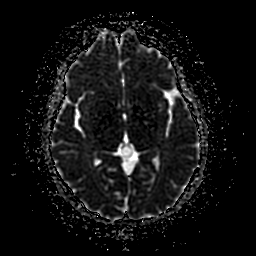
[im 50/50]
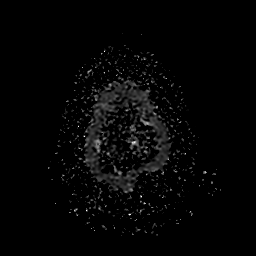

[25 of 48 positions shown; findings below may reference images not displayed]

FINDINGS: MRI HEAD FINDINGS

Brain: There is a tiny cyst measuring 11 mm. There is no acute
infarct or acute hemorrhage. No mass lesion, hydrocephalus, dural
abnormality or extra-axial collection. There are multiple
hyperintense T2-weighted signal lesionswithin the subcortical and
deep supratentorial white matter and within the left cerebellum and
brainstem. The most of the supratentorial lesions are oriented
perpendicularly to the long axis of the lateral ventricles. The
lesion in the right frontal operculum shows contrast enhancement. No
other contrast-enhancing lesions. Possible diffusion restriction at
the lesion of the left middle cerebellar peduncles. No age-advanced
or lobar predominant atrophy. No chronic microhemorrhage or
superficial siderosis.

Vascular: Major intracranial arterial and venous sinus flow voids
are preserved.

Skull and upper cervical spine: The visualized skull base,
calvarium, upper cervical spine and extracranial soft tissues are
normal.

MRI ORBITS FINDINGS

Orbits:

--Globes: Normal.

--Bony orbit: Normal.

--Preseptal soft tissues: Normal.

--Intra- and extraconal orbital fat:  Normal.

--Optic nerves: There is abnormal contrast enhancement of the
cisternal segment of the left optic nerve, proximal to its entry
into the optic canal. There is no abnormal enhancement or
T2-weighted signal of the intraorbital optic nerve. The right optic
nerve is normal.

--Lacrimal glands and fossae: Normal.

--Extraocular muscles: Normal.

The

Visualized sinuses:  No fluid levels or advanced mucosal thickening.

Soft tissues: Normal.
IMPRESSION: 1. Multiple supratentorial and infratentorial white matter lesions,
including a contrast-enhancing lesion of the right frontal
operculum. The pattern is most consistent with demyelinating disease
and both multiple sclerosis and neuromyelitis Deanne spectrum
disorders are possibilities. CSF sampling should be considered.
2. Abnormal enhancement of the cisternal segment of the left optic
nerve, proximal to its entry into the optic canal is also consistent
with optic neuritis in the setting of demyelinating disease.
3. No acute ischemia.

## 2019-05-21 ENCOUNTER — Other Ambulatory Visit: Payer: Self-pay | Admitting: Neurology

## 2019-05-21 MED ORDER — AMPHETAMINE-DEXTROAMPHET ER 15 MG PO CP24: 15.0000 mg | ORAL_CAPSULE | ORAL | 0 refills | Status: DC

## 2019-05-21 NOTE — Telephone Encounter (Signed)
Checked drug registry. She last refilled 04/22/19 #30.

## 2019-05-21 NOTE — Telephone Encounter (Signed)
Pt is requesting a refill of amphetamine-dextroamphetamine (ADDERALL XR) 15 MG 24 hr capsule, to be sent to [place], [address]

## 2019-06-13 ENCOUNTER — Telehealth: Payer: Self-pay | Admitting: Neurology

## 2019-06-13 NOTE — Telephone Encounter (Signed)
Research Visit  Vision is now 20/20 with good color vision.   Has left ON pallor  Motor, sensory, cranial nerve and cerebellar, B/B and ambulation are normal.  She has mild fatigue but denies other cognitive issues (subscale score 1)

## 2019-06-18 ENCOUNTER — Other Ambulatory Visit: Payer: Self-pay | Admitting: Neurology

## 2019-06-18 MED ORDER — AMPHETAMINE-DEXTROAMPHET ER 15 MG PO CP24: 15.0000 mg | ORAL_CAPSULE | ORAL | 0 refills | Status: DC

## 2019-06-18 NOTE — Telephone Encounter (Signed)
Pt is asking for a refill on her amphetamine-dextroamphetamine (ADDERALL XR) 15 MG 24 hr capsule and the 5 mg of the [place] [phone]

## 2019-06-18 NOTE — Addendum Note (Signed)
Addended by: Hope Pigeon on: 06/18/2019 09:38 AM   Modules accepted: Orders

## 2019-07-15 ENCOUNTER — Other Ambulatory Visit: Payer: Self-pay | Admitting: Neurology

## 2019-07-15 MED ORDER — AMPHETAMINE-DEXTROAMPHETAMINE 5 MG PO TABS: 5.0000 mg | ORAL_TABLET | Freq: Every day | ORAL | 0 refills | Status: DC

## 2019-07-15 MED ORDER — AMPHETAMINE-DEXTROAMPHET ER 15 MG PO CP24: 15.0000 mg | ORAL_CAPSULE | ORAL | 0 refills | Status: DC

## 2019-07-15 NOTE — Telephone Encounter (Signed)
Good morning Dr. Felecia Shelling,     I am trying to get my prescription refilled, and I am being told by the front office that I have had two no-show appointments, and that my last appointment was in 2018, and therefore I need to make an appointment with you. I explained I am in the clinical trial, and they said there are no notes in my file about being in the trial. I don't know what to do about this issue, as this has been continuing for two years now. Could you please have my file updated to clear this up? Also, I wanted to know the status of you implementing the 5 mg non-time-released version as well because my pharmacy has no record of it being sent. I also emailed Jamil, but I am unsure if he has returned to work or is even still part of the trial anymore.  Thank you for your help, Tamara Booth      I'm sorry about this.   I will send in refills for the medications (the extended release and the immediate release).   There are only short notes (rather than office visit notes) in the 'main chart' as the details are in the research records so the front office may not have noted the non-visit notes.    [place] is still out on medical leave.   Romie Levee is covering.    Feel free to send in emails like this if there are more issues.     Dr. Felecia Shelling

## 2019-07-15 NOTE — Telephone Encounter (Signed)
1) Medication(s) Requested (by name): (ADDERALL XR) 15 MG  2) Pharmacy of Choice: [place] [facility], [address] AT [address]  3) Special Requests: Patient called requesting refills for her adderall.  Patient states she is currently in a clinical trial with Dr.Sater and usually follows up every 3 months with jamila.   Please follow up.

## 2019-07-22 ENCOUNTER — Other Ambulatory Visit: Payer: Self-pay | Admitting: Neurology

## 2019-07-22 DIAGNOSIS — G35 Multiple sclerosis: Secondary | ICD-10-CM

## 2019-08-17 ENCOUNTER — Other Ambulatory Visit: Payer: Self-pay | Admitting: Neurology

## 2019-08-17 MED ORDER — AMPHETAMINE-DEXTROAMPHETAMINE 5 MG PO TABS: 5.0000 mg | ORAL_TABLET | Freq: Every day | ORAL | 0 refills | Status: DC

## 2019-08-17 MED ORDER — AMPHETAMINE-DEXTROAMPHET ER 15 MG PO CP24: 15.0000 mg | ORAL_CAPSULE | ORAL | 0 refills | Status: DC

## 2019-08-17 NOTE — Telephone Encounter (Signed)
Pt is needing a refill on her amphetamine-dextroamphetamine (ADDERALL XR) 15 MG 24 hr capsule and her amphetamine-dextroamphetamine (ADDERALL) 5 MG tablet sent in to the [facility] on [address]

## 2019-08-23 ENCOUNTER — Other Ambulatory Visit: Payer: No Typology Code available for payment source

## 2019-08-25 ENCOUNTER — Telehealth: Payer: Self-pay | Admitting: *Deleted

## 2019-08-25 DIAGNOSIS — G35 Multiple sclerosis: Secondary | ICD-10-CM

## 2019-08-25 NOTE — Telephone Encounter (Signed)
FYI Pt has called Kara Mead, RN to report that she did register her insurance card, no call back requested.

## 2019-08-25 NOTE — Telephone Encounter (Signed)
Called and spoke with pt. She signed Vumerity start form today in office but I was unable to catch her before she left research department to get copy of insurance cards. I sent her link to sign up for mychart, that way she can upload a copy there. She will send me a mychart message once she has done this. Advised once I receive this, I will send in start form. She has last research visit 09/08/2019.

## 2019-08-25 NOTE — Telephone Encounter (Signed)
Called pt again. She reports research weighed her this am: 135lb. Faxed completed/signed Vumerity start form to [employer] at [phone]. Received fax confirmation.

## 2019-08-26 NOTE — Telephone Encounter (Signed)
Received fax that [employer] received start form and processing for pt.

## 2019-08-27 MED ORDER — DIMETHYL FUMARATE 240 MG PO CPDR: DELAYED_RELEASE_CAPSULE | ORAL | 11 refills | Status: DC

## 2019-08-27 MED ORDER — DIMETHYL FUMARATE 120 & 240 MG PO MISC: ORAL | 0 refills | Status: DC

## 2019-08-27 NOTE — Telephone Encounter (Signed)
Urgent PA submitted for dimethyl fumarate on CMM. [id number]. Waiting on determination from [employer].

## 2019-08-27 NOTE — Telephone Encounter (Signed)
Called pt to let her know PA vumerity denied. Pt must try/fail dimethyl fumarate for. Explained she will need to take titiration pack first: 120mg  po BIDx7 days and then 240mg  po BID thereafter for maintenance dose. I [facility]ribed [facility]. Advised PA are likely needed which we will work on. If copay high, she should ask about copay assistance. She verbalized understanding.

## 2019-08-27 NOTE — Telephone Encounter (Signed)
Submitted PA Vumerity on CMM. Key: [id number]. Waiting on determination from [employer].

## 2019-08-27 NOTE — Addendum Note (Signed)
Addended by: Hillis Range on: 08/27/2019 04:43 PM   Modules accepted: Orders

## 2019-08-30 ENCOUNTER — Ambulatory Visit
Admission: RE | Admit: 2019-08-30 | Discharge: 2019-08-30 | Disposition: A | Discharge status: Home / Self Care | Payer: No Typology Code available for payment source | Source: Ambulatory Visit | Attending: Neurology | Admitting: Neurology

## 2019-08-30 ENCOUNTER — Other Ambulatory Visit: Payer: Self-pay

## 2019-08-30 DIAGNOSIS — G35 Multiple sclerosis: Secondary | ICD-10-CM

## 2019-08-30 MED ORDER — GADOTERIDOL 279.3 MG/ML IV SOLN
12.0000 mL | Freq: Once | INTRAVENOUS | Status: AC | PRN
  Administered 2019-08-30: 12 mL via INTRAVENOUS

## 2019-08-31 ENCOUNTER — Telehealth: Payer: Self-pay | Admitting: Neurology

## 2019-08-31 NOTE — Telephone Encounter (Signed)
Received fax notification from [facility] that PA approved effective from 08/27/2019 through 08/24/2022.

## 2019-08-31 NOTE — Telephone Encounter (Signed)
She had an MRI of the brain yesterday.  It did not show any enhancing lesion but there was one new lesion in the subcortical right parietal lobe that was not present on the previous MRI consistent with a new MS plaque that occurred over the last year.  Therefore, she does appear to have some activity.  We discussed that I will want to repeat an MRI in a year.  If the next MRI also shows anything new or she has a relapse we would definitely want to switch her from Vumerity (or Tecfidera based on insurance) to a different disease modifying therapy.  She is advised to call us if she has any new or worsening neurologic symptoms.

## 2019-09-04 ENCOUNTER — Telehealth: Payer: Self-pay | Admitting: Neurology

## 2019-09-04 NOTE — Telephone Encounter (Signed)
Samantha from [employer] called stating that they are not in network for the pt and that she is needing to get her Vumerity from either [facility], [facility] or [facility]. Please advise.

## 2019-09-07 NOTE — Telephone Encounter (Signed)
I called pt to verify that she is on dimethyl fumarate. She verified this and states she started 09/01/19. She is not on Vumerity, this was not covered by her insurance. She will call back if she has any further questions/concerns.

## 2019-09-07 NOTE — Telephone Encounter (Signed)
Vumerity was not covered for pt. We switched her to generic dimethyl fumarate 08/27/19 and PA approved 08/31/19

## 2019-09-14 ENCOUNTER — Other Ambulatory Visit: Payer: Self-pay | Admitting: *Deleted

## 2019-09-14 MED ORDER — AMPHETAMINE-DEXTROAMPHET ER 15 MG PO CP24: 15.0000 mg | ORAL_CAPSULE | ORAL | 0 refills | Status: DC

## 2019-09-14 MED ORDER — AMPHETAMINE-DEXTROAMPHETAMINE 5 MG PO TABS: 5.0000 mg | ORAL_TABLET | Freq: Every day | ORAL | 0 refills | Status: DC

## 2019-10-14 ENCOUNTER — Other Ambulatory Visit: Payer: Self-pay | Admitting: *Deleted

## 2019-10-14 MED ORDER — AMPHETAMINE-DEXTROAMPHETAMINE 5 MG PO TABS: 5.0000 mg | ORAL_TABLET | Freq: Every day | ORAL | 0 refills | Status: DC

## 2019-10-14 MED ORDER — AMPHETAMINE-DEXTROAMPHET ER 15 MG PO CP24: 15.0000 mg | ORAL_CAPSULE | ORAL | 0 refills | Status: DC

## 2019-10-18 ENCOUNTER — Ambulatory Visit: Payer: Self-pay | Attending: Internal Medicine

## 2019-10-18 DIAGNOSIS — Z23 Encounter for immunization: Secondary | ICD-10-CM | POA: Insufficient documentation

## 2019-10-18 NOTE — Progress Notes (Signed)
   Covid-19 Vaccination Clinic  Name:  Tamara Booth    MRN: [id number] DOB: 06-Dec-1985  10/18/2019  Tamara Booth was observed post Covid-19 immunization for 15 minutes without incident. She was provided with Vaccine Information Sheet and instruction to access the V-Safe system.   Tamara Booth was instructed to call 911 with any severe reactions post vaccine: Marland Kitchen Difficulty breathing  . Swelling of face and throat  . A fast heartbeat  . A bad rash all over body  . Dizziness and weakness   Immunizations Administered    Name Date Dose VIS Date Route   Pfizer COVID-19 Vaccine 10/18/2019  4:58 PM 0.3 mL 07/24/2019 Intramuscular   Manufacturer: [employer], [employer]   Lot: [id number]   NDC: [address]-1

## 2019-11-12 ENCOUNTER — Other Ambulatory Visit: Payer: Self-pay | Admitting: *Deleted

## 2019-11-12 MED ORDER — AMPHETAMINE-DEXTROAMPHETAMINE 5 MG PO TABS: 5.0000 mg | ORAL_TABLET | Freq: Every day | ORAL | 0 refills | Status: DC

## 2019-11-12 MED ORDER — AMPHETAMINE-DEXTROAMPHET ER 15 MG PO CP24: 15.0000 mg | ORAL_CAPSULE | ORAL | 0 refills | Status: DC

## 2019-11-18 ENCOUNTER — Ambulatory Visit: Payer: No Typology Code available for payment source | Attending: Internal Medicine

## 2019-11-18 DIAGNOSIS — Z23 Encounter for immunization: Secondary | ICD-10-CM

## 2019-11-18 NOTE — Progress Notes (Signed)
   Covid-19 Vaccination Clinic  Name:  Tamara Booth    MRN: [id number] DOB: 12-Mar-1986  11/18/2019  [name] was observed post Covid-19 immunization for 15 minutes without incident. She was provided with Vaccine Information Sheet and instruction to access the V-Safe system.   [name] was instructed to call 911 with any severe reactions post vaccine: Marland Kitchen Difficulty breathing  . Swelling of face and throat  . A fast heartbeat  . A bad rash all over body  . Dizziness and weakness   Immunizations Administered    Name Date Dose VIS Date Route   Pfizer COVID-19 Vaccine 11/18/2019 10:41 AM 0.3 mL 07/24/2019 Intramuscular   Manufacturer: [employer], [employer]   Lot: [id number]   NDC: [address]-2

## 2019-12-14 ENCOUNTER — Other Ambulatory Visit: Payer: Self-pay | Admitting: *Deleted

## 2019-12-14 MED ORDER — AMPHETAMINE-DEXTROAMPHETAMINE 5 MG PO TABS: 5.0000 mg | ORAL_TABLET | Freq: Every day | ORAL | 0 refills | Status: DC

## 2019-12-14 MED ORDER — AMPHETAMINE-DEXTROAMPHET ER 15 MG PO CP24: 15.0000 mg | ORAL_CAPSULE | ORAL | 0 refills | Status: DC

## 2020-01-12 ENCOUNTER — Other Ambulatory Visit: Payer: Self-pay | Admitting: *Deleted

## 2020-01-12 MED ORDER — AMPHETAMINE-DEXTROAMPHET ER 15 MG PO CP24: 15.0000 mg | ORAL_CAPSULE | ORAL | 0 refills | Status: DC

## 2020-01-12 MED ORDER — AMPHETAMINE-DEXTROAMPHETAMINE 5 MG PO TABS: 5.0000 mg | ORAL_TABLET | Freq: Every day | ORAL | 0 refills | Status: DC

## 2020-02-08 ENCOUNTER — Ambulatory Visit: Payer: No Typology Code available for payment source | Admitting: Neurology

## 2020-02-11 ENCOUNTER — Other Ambulatory Visit: Payer: Self-pay | Admitting: *Deleted

## 2020-02-11 MED ORDER — AMPHETAMINE-DEXTROAMPHET ER 15 MG PO CP24: 15.0000 mg | ORAL_CAPSULE | ORAL | 0 refills | Status: DC

## 2020-02-11 MED ORDER — AMPHETAMINE-DEXTROAMPHETAMINE 5 MG PO TABS: 5.0000 mg | ORAL_TABLET | Freq: Every day | ORAL | 0 refills | Status: DC

## 2020-03-10 ENCOUNTER — Other Ambulatory Visit: Payer: Self-pay

## 2020-03-10 ENCOUNTER — Encounter: Payer: Self-pay | Admitting: Family Medicine

## 2020-03-10 MED ORDER — AMPHETAMINE-DEXTROAMPHET ER 15 MG PO CP24: 15.0000 mg | ORAL_CAPSULE | ORAL | 0 refills | Status: DC

## 2020-03-10 MED ORDER — AMPHETAMINE-DEXTROAMPHETAMINE 5 MG PO TABS: 5.0000 mg | ORAL_TABLET | Freq: Every day | ORAL | 0 refills | Status: DC

## 2020-03-10 NOTE — Telephone Encounter (Signed)
Last fill for both 02-11-20. Cannot check drug registry.

## 2020-03-11 ENCOUNTER — Telehealth: Payer: Self-pay | Admitting: Neurology

## 2020-03-11 NOTE — Telephone Encounter (Signed)
Pt is requesting a refill for amphetamine-dextroamphetamine (ADDERALL XR) 15 MG 24 hr capsule & amphetamine-dextroamphetamine (ADDERALL) 5 MG tablet.  Pharmacy: [facility] [phone]

## 2020-03-14 NOTE — Telephone Encounter (Signed)
Both prescriptions were sent in on 03/10/20 by Dr. Epimenio Foot.

## 2020-04-12 ENCOUNTER — Other Ambulatory Visit: Payer: Self-pay | Admitting: *Deleted

## 2020-04-12 MED ORDER — AMPHETAMINE-DEXTROAMPHET ER 15 MG PO CP24: 15.0000 mg | ORAL_CAPSULE | ORAL | 0 refills | Status: DC

## 2020-04-12 MED ORDER — AMPHETAMINE-DEXTROAMPHETAMINE 5 MG PO TABS: 5.0000 mg | ORAL_TABLET | Freq: Every day | ORAL | 0 refills | Status: DC

## 2020-04-28 ENCOUNTER — Ambulatory Visit: Payer: Self-pay | Admitting: Family Medicine

## 2020-05-10 ENCOUNTER — Other Ambulatory Visit: Payer: Self-pay | Admitting: *Deleted

## 2020-05-10 MED ORDER — AMPHETAMINE-DEXTROAMPHET ER 15 MG PO CP24: 15.0000 mg | ORAL_CAPSULE | ORAL | 0 refills | Status: DC

## 2020-05-10 MED ORDER — AMPHETAMINE-DEXTROAMPHETAMINE 5 MG PO TABS: 5.0000 mg | ORAL_TABLET | Freq: Every day | ORAL | 0 refills | Status: DC

## 2020-06-09 ENCOUNTER — Ambulatory Visit: Payer: BC Managed Care – PPO | Admitting: Family Medicine

## 2020-06-09 ENCOUNTER — Encounter: Payer: Self-pay | Admitting: Family Medicine

## 2020-06-09 VITALS — BP 111/78 | HR 69 | Ht 68.0 in | Wt 135.0 lb

## 2020-06-09 DIAGNOSIS — H469 Unspecified optic neuritis: Secondary | ICD-10-CM | POA: Diagnosis not present

## 2020-06-09 DIAGNOSIS — M2540 Effusion, unspecified joint: Secondary | ICD-10-CM

## 2020-06-09 DIAGNOSIS — G35 Multiple sclerosis: Secondary | ICD-10-CM | POA: Diagnosis not present

## 2020-06-09 DIAGNOSIS — R5382 Chronic fatigue, unspecified: Secondary | ICD-10-CM

## 2020-06-09 MED ORDER — AMPHETAMINE-DEXTROAMPHET ER 15 MG PO CP24: 15.0000 mg | ORAL_CAPSULE | ORAL | 0 refills | Status: DC

## 2020-06-09 MED ORDER — AMPHETAMINE-DEXTROAMPHETAMINE 5 MG PO TABS: 5.0000 mg | ORAL_TABLET | Freq: Every day | ORAL | 0 refills | Status: DC

## 2020-06-09 NOTE — Patient Instructions (Signed)
Below is our plan:  We will continue dimethyl fumerate and Adderall as prescribed.   Please make sure you are staying well hydrated. I recommend 50-60 ounces daily. Well balanced diet and regular exercise encouraged.   Please continue follow up with care team as directed.   Follow up with Dr Epimenio Foot in 6 months   You may receive a survey regarding today's visit. I encourage you to leave honest feed back as I do use this information to improve patient care. Thank you for seeing me today!     Multiple Sclerosis Multiple sclerosis (MS) is a disease of the brain, spinal cord, and optic nerves (central nervous system). It causes the body's disease-fighting (immune) system to destroy the protective covering (myelin sheath) around nerves in the brain. When this happens, signals (nerve impulses) going to and from the brain and spinal cord do not get sent properly or may not get sent at all. There are several types of MS:  Relapsing-remitting MS. This is the most common type. This causes sudden attacks of symptoms. After an attack, you may recover completely until the next attack, or some symptoms may remain permanently.  Secondary progressive MS. This usually develops after the onset of relapsing-remitting MS. Similar to relapsing-remitting MS, this type also causes sudden attacks of symptoms. Attacks may be less frequent, but symptoms slowly get worse (progress) over time.  Primary progressive MS. This causes symptoms that steadily progress over time. This type of MS does not cause sudden attacks of symptoms. The age of onset of MS varies, but it often develops between 60-52 years of age. MS is a lifelong (chronic) condition. There is no cure, but treatment can help slow down the progression of the disease. What are the causes? The cause of this condition is not known. What increases the risk? You are more likely to develop this condition if:  You are a woman.  You have a relative with MS.  However, the condition is not passed from parent to child (inherited).  You have a lack (deficiency) of vitamin D.  You smoke. MS is more common in the [place] than in the [place]. What are the signs or symptoms? Relapsing-remitting and secondary progressive MS cause symptoms to occur in episodes or attacks that may last weeks to months. There may be long periods between attacks in which there are almost no symptoms. Primary progressive MS causes symptoms to steadily progress after they develop. Symptoms of MS vary because of the many different ways it affects the central nervous system. The main symptoms include:  Vision problems and eye pain.  Numbness.  Weakness.  Inability to move your arms, hands, feet, or legs (paralysis).  Balance problems.  Shaking that you cannot control (tremors).  Muscle spasms.  Problems with thinking (cognitive changes). MS can also cause symptoms that are associated with the disease, but are not always the direct result of an MS attack. They may include:  Inability to control urination or bowel movements (incontinence).  Headaches.  Fatigue.  Inability to tolerate heat.  Emotional changes.  Depression.  Pain. How is this diagnosed? This condition is diagnosed based on:  Your symptoms.  A neurological exam. This involves checking central nervous system function, such as nerve function, reflexes, and coordination.  MRIs of the brain and spinal cord.  Lab tests, including a lumbar puncture that tests the fluid that surrounds the brain and spinal cord (cerebrospinal fluid).  Tests to measure the electrical activity of the  brain in response to stimulation (evoked potentials). How is this treated? There is no cure for MS, but medicines can help decrease the number and frequency of attacks and help relieve nuisance symptoms. Treatment options may include:  Medicines that reduce the frequency of attacks. These  medicines may be given by injection, by mouth (orally), or through an IV.  Medicines that reduce inflammation (steroids). These may provide short-term relief of symptoms.  Medicines to help control pain, depression, fatigue, or incontinence.  Vitamin D, if you have a deficiency.  Using devices to help you move around (assistive devices), such as braces, a cane, or a walker.  Physical therapy to strengthen and stretch your muscles.  Occupational therapy to help you with everyday tasks.  Alternative or complementary treatments such as exercise, massage, or acupuncture. Follow these instructions at home:  Take over-the-counter and prescription medicines only as told by your health care provider.  Do not drive or use heavy machinery while taking prescription pain medicine.  Use assistive devices as recommended by your physical therapist or your health care provider.  Exercise as directed by your health care provider.  Return to your normal activities as told by your health care provider. Ask your health care provider what activities are safe for you.  Reach out for support. Share your feelings with friends, family, or a support group.  Keep all follow-up visits as told by your health care provider and therapists. This is important. Where to find more information  National Multiple Sclerosis Society: https://www.nationalmssociety.org Contact a health care provider if:  You feel depressed.  You develop new pain or numbness.  You have tremors.  You have problems with sexual function. Get help right away if:  You develop paralysis.  You develop numbness.  You have problems with your bladder or bowel function.  You develop double vision.  You lose vision in one or both eyes.  You develop suicidal thoughts.  You develop severe confusion. If you ever feel like you may hurt yourself or others, or have thoughts about taking your own life, get help right away. You can go to  your nearest emergency department or call:  Your local emergency services (911 in the [place]  A suicide crisis helpline, such as the National Suicide Prevention Lifeline at 567 102 5518. This is open 24 hours a day. Summary  Multiple sclerosis (MS) is a disease of the central nervous system that causes the body's immune system to destroy the protective covering (myelin sheath) around nerves in the brain.  There are 3 types of MS: relapsing-remitting, secondary progressive, and primary progressive. Relapsing-remitting and secondary progressive MS cause symptoms to occur in episodes or attacks that may last weeks to months. Primary progressive MS causes symptoms to steadily progress after they develop.  There is no cure for MS, but medicines can help decrease the number and frequency of attacks and help relieve nuisance symptoms. Treatment may also include physical or occupational therapy.  If you develop numbness, paralysis, vision problems, or other neurological symptoms, get help right away. This information is not intended to replace advice given to you by your health care provider. Make sure you discuss any questions you have with your health care provider. Document Revised: 07/12/2017 Document Reviewed: 10/08/2016 Elsevier Patient Education  2020 [employer].

## 2020-06-09 NOTE — Progress Notes (Signed)
Chief Complaint  Patient presents with  . Follow-up    rm 2  . Multiple Sclerosis    Pt said she has no new sx.     HISTORY OF PRESENT ILLNESS: Today 06/09/20  Tamara Booth is a 34 y.o. female here today for follow up for RRMS follow up. She continues dimethyl fumerate started in 08/2019. She is tolerating it fairly well. She does have occasional flushing. She was participant in [employer] 87[employer] research study, completed in 08/2019.   She continues Adderall XR 15 mg daily and IR 5mg  as needed for MS fatigue and ADD. She reports that pain and fatigue are worse with bad weather. She has stiffness and generalized pain with rainy days. She does not usually take anything for pain. She may take ibuprofen from time to time. She also uses a TENS unit.   She is sleeping well. She does take melatonin 2.5mg  nightly for sleep and anxiety. Mood is stable.   She has noticed swelling and tenderness of her right fifth digit at PIP joint. She has not found obvious triggers. She has not taken any medication for pain. She was positive Lyme ab in 12/2018.    HISTORY (copied from Dr 01/2019 note on 07/10/2017)  I had the pleasure seeing you patient, Tamara Booth, at [facility] for neurologic consultation regarding her recent diagnosis of MS following an episode of left optic neuritis.  On 06/28/2017, she felt that there was something in her left eye when she woke up. As the day went on she began to note more difficulty with her vision and this worsened further over the next couple days until she was unable to see. A couple days later she presented to the [facility] MRI was consistent with left optic neuritis and also showed other changes consistent with MS. She received 5 days of IV Solu-Medrol.   Her last day of steroid was 07/07/2017.   Over the past couple days she is noting that she is able to see a little vision on the top and bottom of the left visual field nothing in the middle except  for bright lights.   She cannot see any colors out of that eye. She denies other neurologic symptoms. Specifically, she has not noted any problems with gait, balance, strength, sensation, or bladder function. She notes some fatigue but feels that she does about the same as other people she knows. She sometimes has insomnia and takes melatonin when necessary. She denies any problems with cognition or mood.  Her brother has alopecia.  No FH of MS   I personally reviewed the MRI of the brain performed 07/03/2017. It shows an enhancing lesion in the left optic nerve just distal to the chiasm. There are multiple T2/FLAIR hyperintense foci in the periventricular, juxtacortical deep white matter of the hemispheres. There are also foci in the pons, left middle cerebellar peduncle, left cerebellar hemisphere and left thalamus  Two of the lesions enhance,  one in the right juxtacortical white matter and another in the left thalamus.  There is a subtle focus at the cervicomedullary junction on the sagittal FLAIR images of the brain that is not seen on the T2-weighted images of the cervical spine performed last week.     REVIEW OF SYSTEMS: Out of a complete 14 system review of symptoms, the patient complains only of the following symptoms, fatigue, anxiety, right fifth digit swelling and all other reviewed systems are negative.   ALLERGIES: Allergies  Allergen  Reactions  . Vicodin [Hydrocodone-Acetaminophen] Shortness Of Breath and Itching     HOME MEDICATIONS: Outpatient Medications Prior to Visit  Medication Sig Dispense Refill  . Dimethyl Fumarate 240 MG CPDR Take 1 capsule by mouth twice daily 60 capsule 11  . Melatonin 2.5 MG CAPS Take 2.5 mg by mouth at bedtime.    Marland Kitchen amphetamine-dextroamphetamine (ADDERALL XR) 15 MG 24 hr capsule Take 1 capsule by mouth every morning. 30 capsule 0  . amphetamine-dextroamphetamine (ADDERALL) 5 MG tablet Take 1 tablet (5 mg total) by mouth daily. Take one or two  tablets in the afternoon as needed 60 tablet 0  . Armodafinil 200 MG TABS Take 1/2 to 1 po qAM 30 tablet 5  . Dimethyl Fumarate 120 & 240 MG MISC 120 mg by mouth twice daily for 7 days then 240mg  by mouth twice daily for 23 days. 60 each 0  . doxycycline (VIBRA-TABS) 100 MG tablet Take 1 tablet by mouth twice daily for 21 days 42 tablet 0   No facility-administered medications prior to visit.     PAST MEDICAL HISTORY: Past Medical History:  Diagnosis Date  . Optic neuritis 07/03/2017     PAST SURGICAL HISTORY: Past Surgical History:  Procedure Laterality Date  . ABDOMINAL HERNIA REPAIR     at the age of 52  . WISDOM TOOTH EXTRACTION       FAMILY HISTORY: Family History  Problem Relation Age of Onset  . Pulmonary fibrosis Father   . Alopecia Brother      SOCIAL HISTORY: Social History   Socioeconomic History  . Marital status: Married    Spouse name: Not on file  . Number of children: Not on file  . Years of education: Not on file  . Highest education level: Not on file  Occupational History  . Not on file  Tobacco Use  . Smoking status: Current Every Day Smoker    Packs/day: 0.75    Years: 16.00    Pack years: 12.00    Types: Cigarettes  . Smokeless tobacco: Never Used  Vaping Use  . Vaping Use: Never used  Substance and Sexual Activity  . Alcohol use: No  . Drug use: No  . Sexual activity: Never  Other Topics Concern  . Not on file  Social History Narrative  . Not on file   Social Determinants of Health   Financial Resource Strain:   . Difficulty of Paying Living Expenses: Not on file  Food Insecurity:   . Worried About 12 in the Last Year: Not on file  . Ran Out of Food in the Last Year: Not on file  Transportation Needs:   . Lack of Transportation (Medical): Not on file  . Lack of Transportation (Non-Medical): Not on file  Physical Activity:   . Days of Exercise per Week: Not on file  . Minutes of Exercise per Session: Not  on file  Stress:   . Feeling of Stress : Not on file  Social Connections:   . Frequency of Communication with Friends and Family: Not on file  . Frequency of Social Gatherings with Friends and Family: Not on file  . Attends Religious Services: Not on file  . Active Member of Clubs or Organizations: Not on file  . Attends Programme researcher, broadcasting/film/video Meetings: Not on file  . Marital Status: Not on file  Intimate Partner Violence:   . Fear of Current or Ex-Partner: Not on file  . Emotionally Abused: Not on  file  . Physically Abused: Not on file  . Sexually Abused: Not on file      PHYSICAL EXAM  Vitals:   06/09/20 0809  BP: 111/78  Pulse: 69  Weight: 135 lb (61.2 kg)  Height: 5\' 8"  (1.727 m)   Body mass index is 20.53 kg/m.   Generalized: Well developed, in no acute distress   Neurological examination  Mentation: Alert oriented to time, place, history taking. Follows all commands speech and language fluent Cranial nerve II-XII: Pupils were equal round reactive to light. Extraocular movements were full, visual field were full on confrontational test. Facial sensation and strength were normal. Head turning and shoulder shrug  were normal and symmetric. Motor: The motor testing reveals 5 over 5 strength of all 4 extremities. Good symmetric motor tone is noted throughout. Very mild edema and tenderness noted of right fifth PIP joint. No signs of infection noted.  Sensory: Sensory testing is intact to soft touch on all 4 extremities. No evidence of extinction is noted.  Coordination: Cerebellar testing reveals good finger-nose-finger and heel-to-shin bilaterally.  Gait and station: Gait is normal.  Reflexes: Deep tendon reflexes are symmetric and normal bilaterally.      DIAGNOSTIC DATA (LABS, IMAGING, TESTING) - I reviewed patient records, labs, notes, testing and imaging myself where available.  Lab Results  Component Value Date   WBC 6.8 07/02/2017   HGB 13.2 07/02/2017   HCT  39.3 07/02/2017   MCV 95.6 07/02/2017   PLT 185 07/02/2017      Component Value Date/Time   NA 136 07/02/2017 1834   K 4.0 07/02/2017 1834   CL 104 07/02/2017 1834   CO2 25 07/02/2017 1834   GLUCOSE 115 (H) 07/02/2017 1834   BUN <5 (L) 07/02/2017 1834   CREATININE 0.79 07/02/2017 1834   CALCIUM 9.0 07/02/2017 1834   GFRNONAA >60 07/02/2017 1834   GFRAA >60 07/02/2017 1834   No results found for: CHOL, HDL, LDLCALC, LDLDIRECT, TRIG, CHOLHDL No results found for: 07/04/2017 No results found for: VITAMINB12 No results found for: TSH    ASSESSMENT AND PLAN  34 y.o. year old female  has a past medical history of Optic neuritis (07/03/2017). here with   Multiple sclerosis (HCC)  Optic neuritis due to multiple sclerosis (HCC) - Plan: CBC with Differential/Platelets, CMP  Joint swelling  Chronic fatigue - Plan: CBC with Differential/Platelets, CMP, Vitamin D, 25-hydroxy, amphetamine-dextroamphetamine (ADDERALL XR) 15 MG 24 hr capsule, amphetamine-dextroamphetamine (ADDERALL) 5 MG tablet   Raymond is doing very well today.  We will continue generic Tecfidera.  I will update lab work today.  She will continue Adderall XR 15 mg daily and IR 5 mg as needed.  We have discussed concerns of swelling of right fifth digit.  She does not wish to take any additional medications at this time.  We have discussed complementary treatment options at home and over-the-counter medication she may use.  She will notify Clearnce Hasten of any worsening.  Healthy lifestyle habits encouraged.  She will follow-up with Dr. [place] in 6 months, sooner if needed.  She verbalizes understanding and agreement with this plan.  I spent 20 minutes of face-to-face and non-face-to-face time with patient.  This included previsit chart review, lab review, study review, order entry, electronic health record documentation, patient education.    Epimenio Foot, MSN, FNP-C 06/09/2020, 11:20 AM  [facility] [address]  [address] [place], [place] [phone]

## 2020-06-10 LAB — CBC WITH DIFFERENTIAL/PLATELET
Basophils Absolute: 0 10*3/uL (ref 0.0–0.2)
Basos: 0 %
EOS (ABSOLUTE): 0.1 10*3/uL (ref 0.0–0.4)
Eos: 1 %
Hematocrit: 49.5 % — ABNORMAL HIGH (ref 34.0–46.6)
Hemoglobin: 16.3 g/dL — ABNORMAL HIGH (ref 11.1–15.9)
Immature Grans (Abs): 0 10*3/uL (ref 0.0–0.1)
Immature Granulocytes: 0 %
Lymphocytes Absolute: 1.9 10*3/uL (ref 0.7–3.1)
Lymphs: 20 %
MCH: 33.2 pg — ABNORMAL HIGH (ref 26.6–33.0)
MCHC: 32.9 g/dL (ref 31.5–35.7)
MCV: 101 fL — ABNORMAL HIGH (ref 79–97)
Monocytes Absolute: 0.8 10*3/uL (ref 0.1–0.9)
Monocytes: 8 %
Neutrophils Absolute: 6.5 10*3/uL (ref 1.4–7.0)
Neutrophils: 71 %
Platelets: 178 10*3/uL (ref 150–450)
RBC: 4.91 x10E6/uL (ref 3.77–5.28)
RDW: 11.6 % — ABNORMAL LOW (ref 11.7–15.4)
WBC: 9.2 10*3/uL (ref 3.4–10.8)

## 2020-06-10 LAB — COMPREHENSIVE METABOLIC PANEL
ALT: 9 IU/L (ref 0–32)
AST: 11 IU/L (ref 0–40)
Albumin/Globulin Ratio: 2.4 — ABNORMAL HIGH (ref 1.2–2.2)
Albumin: 5.3 g/dL — ABNORMAL HIGH (ref 3.8–4.8)
Alkaline Phosphatase: 55 IU/L (ref 44–121)
BUN/Creatinine Ratio: 14 (ref 9–23)
BUN: 11 mg/dL (ref 6–20)
Bilirubin Total: 0.3 mg/dL (ref 0.0–1.2)
CO2: 24 mmol/L (ref 20–29)
Calcium: 9.9 mg/dL (ref 8.7–10.2)
Chloride: 101 mmol/L (ref 96–106)
Creatinine, Ser: 0.8 mg/dL (ref 0.57–1.00)
GFR calc Af Amer: 110 mL/min/{1.73_m2} (ref >59)
GFR calc non Af Amer: 95 mL/min/{1.73_m2} (ref >59)
Globulin, Total: 2.2 g/dL (ref 1.5–4.5)
Glucose: 86 mg/dL (ref 65–99)
Potassium: 5.4 mmol/L — ABNORMAL HIGH (ref 3.5–5.2)
Sodium: 138 mmol/L (ref 134–144)
Total Protein: 7.5 g/dL (ref 6.0–8.5)

## 2020-06-10 LAB — VITAMIN D 25 HYDROXY (VIT D DEFICIENCY, FRACTURES): Vit D, 25-Hydroxy: 18 ng/mL — ABNORMAL LOW (ref 30.0–100.0)

## 2020-06-16 ENCOUNTER — Telehealth: Payer: Self-pay | Admitting: *Deleted

## 2020-06-16 NOTE — Telephone Encounter (Signed)
I called pt relayed the results as per A/NP note.  She verbalized understanding. If will forward to her pcp as well.

## 2020-06-16 NOTE — Telephone Encounter (Signed)
-----   Message from Shawnie Dapper, NP sent at 06/16/2020  4:26 PM EDT ----- Please let her know that her labs look ok. Her red blood sell size is a little larger than normal. Most likely related to smoking. I would encourage smoking cessation and close monitoring with PCP. Potassium was slightly elevated but sometimes we can see falsely elevated readings. I would recommend we recheck with PCP at follow up. We will also recheck here when we see her next. Her vitamin D is low. I would suggest she take vitamin D OTC 5,000iu daily.

## 2020-06-28 ENCOUNTER — Other Ambulatory Visit: Payer: Self-pay | Admitting: *Deleted

## 2020-06-28 DIAGNOSIS — R5382 Chronic fatigue, unspecified: Secondary | ICD-10-CM

## 2020-06-28 NOTE — Telephone Encounter (Signed)
Last seen 06-09-20.Prescription Refills Received: Today [name]  [facility] Pool Good afternoon,  I wasn't sure what days the office is open next week, if any, so I'm reaching out early.  1) I just received a letter from [employer] saying they need more information from my prescribing doctor for my Adderall XR prescription, as the prescription is changing to a different formulary tier effective August 13, 2018. It doesn't state what information is needed, just that information is needed... if that makes sense. My insurance hasn't changed from the one in my file.  2) Could you please call in my refills of both prescriptions before the office closes for the holiday?

## 2020-06-30 MED ORDER — AMPHETAMINE-DEXTROAMPHETAMINE 5 MG PO TABS: 5.0000 mg | ORAL_TABLET | Freq: Every day | ORAL | 0 refills | Status: DC

## 2020-06-30 MED ORDER — AMPHETAMINE-DEXTROAMPHET ER 15 MG PO CP24: 15.0000 mg | ORAL_CAPSULE | ORAL | 0 refills | Status: DC

## 2020-07-11 ENCOUNTER — Encounter: Payer: Self-pay | Admitting: *Deleted

## 2020-08-03 ENCOUNTER — Encounter: Payer: Self-pay | Admitting: Family Medicine

## 2020-08-03 DIAGNOSIS — R5382 Chronic fatigue, unspecified: Secondary | ICD-10-CM

## 2020-08-03 MED ORDER — AMPHETAMINE-DEXTROAMPHETAMINE 5 MG PO TABS: 5.0000 mg | ORAL_TABLET | Freq: Every day | ORAL | 0 refills | Status: DC

## 2020-08-03 MED ORDER — AMPHETAMINE-DEXTROAMPHET ER 15 MG PO CP24: 15.0000 mg | ORAL_CAPSULE | ORAL | 0 refills | Status: DC

## 2020-09-08 ENCOUNTER — Other Ambulatory Visit: Payer: Self-pay | Admitting: *Deleted

## 2020-09-08 DIAGNOSIS — G35 Multiple sclerosis: Secondary | ICD-10-CM

## 2020-09-08 MED ORDER — DIMETHYL FUMARATE 240 MG PO CPDR: DELAYED_RELEASE_CAPSULE | ORAL | 11 refills | Status: DC

## 2020-09-14 ENCOUNTER — Other Ambulatory Visit: Payer: Self-pay | Admitting: *Deleted

## 2020-09-14 DIAGNOSIS — R5382 Chronic fatigue, unspecified: Secondary | ICD-10-CM

## 2020-09-14 MED ORDER — AMPHETAMINE-DEXTROAMPHETAMINE 5 MG PO TABS: 5.0000 mg | ORAL_TABLET | Freq: Every day | ORAL | 0 refills | Status: DC

## 2020-09-14 MED ORDER — AMPHETAMINE-DEXTROAMPHET ER 15 MG PO CP24: 15.0000 mg | ORAL_CAPSULE | ORAL | 0 refills | Status: DC

## 2020-10-12 ENCOUNTER — Other Ambulatory Visit: Payer: Self-pay | Admitting: *Deleted

## 2020-10-12 DIAGNOSIS — R5382 Chronic fatigue, unspecified: Secondary | ICD-10-CM

## 2020-10-12 MED ORDER — AMPHETAMINE-DEXTROAMPHET ER 15 MG PO CP24
15.0000 mg | ORAL_CAPSULE | ORAL | 0 refills | Status: DC
Start: 2020-10-12 — End: 2020-11-14

## 2020-10-12 MED ORDER — AMPHETAMINE-DEXTROAMPHETAMINE 5 MG PO TABS: 5.0000 mg | ORAL_TABLET | Freq: Every day | ORAL | 0 refills | Status: DC

## 2020-10-12 NOTE — Telephone Encounter (Signed)
Per  registry, pt last filled Dextroamp-Amphet Er 15 Mg Cap #30 on 09/14/2020 and Dextroamp-Amphetamine 5 Mg Tab #60 on 09/14/20. Will send Rx refills to Amy NP. Pt's next appt is 12/08/20 with Dr Epimenio Foot.

## 2020-10-12 NOTE — Telephone Encounter (Signed)
Patient sent a MyChart message and requested refills of both Adderall prescriptions. Per [place] registry, pt last filled Dextroamp-Amphet Er 15 Mg Cap #30 on 09/14/2020 and Dextroamp-Amphetamine 5 Mg Tab #60 on 09/14/20. Will send Rx refills to Amy NP. Pt's next appt is 12/08/20 with Dr Epimenio Foot.

## 2020-11-10 ENCOUNTER — Encounter: Payer: Self-pay | Admitting: Family Medicine

## 2020-11-10 DIAGNOSIS — R5382 Chronic fatigue, unspecified: Secondary | ICD-10-CM

## 2020-11-14 MED ORDER — AMPHETAMINE-DEXTROAMPHET ER 15 MG PO CP24: 15.0000 mg | ORAL_CAPSULE | ORAL | 0 refills | Status: DC

## 2020-11-14 MED ORDER — AMPHETAMINE-DEXTROAMPHETAMINE 5 MG PO TABS: 5.0000 mg | ORAL_TABLET | Freq: Every day | ORAL | 0 refills | Status: DC

## 2020-12-08 ENCOUNTER — Encounter: Payer: Self-pay | Admitting: Neurology

## 2020-12-08 ENCOUNTER — Ambulatory Visit: Payer: BC Managed Care – PPO | Admitting: Neurology

## 2020-12-08 VITALS — BP 107/70 | HR 82 | Ht 68.0 in | Wt 141.0 lb

## 2020-12-08 DIAGNOSIS — F988 Other specified behavioral and emotional disorders with onset usually occurring in childhood and adolescence: Secondary | ICD-10-CM

## 2020-12-08 DIAGNOSIS — G35A Relapsing-remitting multiple sclerosis: Secondary | ICD-10-CM | POA: Insufficient documentation

## 2020-12-08 DIAGNOSIS — W19XXXD Unspecified fall, subsequent encounter: Secondary | ICD-10-CM

## 2020-12-08 DIAGNOSIS — M545 Low back pain, unspecified: Secondary | ICD-10-CM

## 2020-12-08 DIAGNOSIS — G35 Multiple sclerosis: Secondary | ICD-10-CM

## 2020-12-08 DIAGNOSIS — H469 Unspecified optic neuritis: Secondary | ICD-10-CM | POA: Diagnosis not present

## 2020-12-08 DIAGNOSIS — R5382 Chronic fatigue, unspecified: Secondary | ICD-10-CM | POA: Diagnosis not present

## 2020-12-08 DIAGNOSIS — Z298 Encounter for other specified prophylactic measures: Secondary | ICD-10-CM

## 2020-12-08 MED ORDER — AMPHETAMINE-DEXTROAMPHETAMINE 5 MG PO TABS: ORAL_TABLET | ORAL | 0 refills | Status: DC

## 2020-12-08 NOTE — Progress Notes (Signed)
[facility]  PATIENT: Tamara Booth DOB: Aug 29, 1985  REFERRING DOCTOR OR PCP:  Juluis Rainier SOURCE: Patient, hospital records, imaging and lab reports, MRI images on PACS  _________________________________   HISTORICAL  CHIEF COMPLAINT:  Chief Complaint  Patient presents with  . Follow-up    RM 12, alone. Last seen 06/09/2020 by AL,NP. On dimethyl fumarate for MS.    HISTORY OF PRESENT ILLNESS:   Tamara Booth is a 35 y.o. woman diagnosed with MS following an episode of left optic neuritis in 2018.  Update 12/08/2020: She was in the Vumerity drug study and was on that medication from late 2018 until late 2020.  Due to insurance issues she switched to dimethyl fumarate instead of Vumerity upon the end of the study.  DMF co-pays are only $10  She feels neurologically stable with no exacerbations.  Specifically, she has not noted any problems with gait, balance, strength, sensation, or bladder function. She did trip over her dog and had x-rays.  She has pain in the lower back that is not improving.    It is not radiating into legs.   She was having back spasms and placed on valium.    She notes some fatigue but feels that she does about the same as other people she knows. She sometimes has insomnia and takes melatonin when necessary. She denies any problems with cognition or mood.  We discussed her 2021 MRI showed one new lesion.   Therefore, she does appear to have some activity.    If the next MRI also shows anything new or she has a relapse we would definitely want to switch her from Vumerity (or Tecfidera based on insurance) to a different disease modifying therapy.      MS HISTORY: On 06/28/2017, she felt that there was something in her left eye when she woke up. As the day went on she began to note more difficulty with her vision and this worsened further over the next couple days until she was unable to see. A couple days later she presented to the [facility] MRI was consistent with left optic neuritis and also showed other changes consistent with MS. She received 5 days of IV Solu-Medrol.  Vision improved.  She entered the [employer]-8700 drug study.  The active medication has since been approved by the FDA as Vumerity.  She tolerated it well after the first couple days and has not had any exacerbations.   Her brother has alopecia.  No FH of MS   IMAGING: MRI of the brain performed 07/03/2017 shows an enhancing lesion in the left optic nerve just distal to the chiasm. There are multiple T2/FLAIR hyperintense foci in the periventricular, juxtacortical deep white matter of the hemispheres. There are also foci in the pons, left middle cerebellar peduncle, left cerebellar hemisphere and left thalamus  Two of the lesions enhance,  one in the right juxtacortical white matter and another in the left thalamus.  There is a subtle focus at the cervicomedullary junction on the sagittal FLAIR images of the brain that is not seen on the T2-weighted images of the cervical spine performed last week.   MRI of the brain 08/30/2019 showed no enhancing lesion but there was one new lesion in the subcortical right parietal lobe that was not present on the previous MRI from 09/22/2018 consistent with a new MS plaque that occurred over the prior year.   REVIEW OF SYSTEMS: Constitutional: No fevers, chills, sweats, or change in appetite Eyes:  No visual changes, double vision, eye pain Ear, nose and throat: No hearing loss, ear pain, nasal congestion, sore throat Cardiovascular: No chest pain, palpitations Respiratory: No shortness of breath at rest or with exertion.   No wheezes GastrointestinaI: No nausea, vomiting, diarrhea, abdominal pain, fecal incontinence Genitourinary: No dysuria, urinary retention or frequency.  No nocturia. Musculoskeletal: No neck pain, back pain Integumentary: No rash, pruritus, skin lesions Neurological: as above Psychiatric: No  depression at this time.  No anxiety Endocrine: No palpitations, diaphoresis, change in appetite, change in weigh or increased thirst Hematologic/Lymphatic: No anemia, purpura, petechiae. Allergic/Immunologic: No itchy/runny eyes, nasal congestion, recent allergic reactions, rashes  ALLERGIES: Allergies  Allergen Reactions  . Vicodin [Hydrocodone-Acetaminophen] Shortness Of Breath and Itching    HOME MEDICATIONS:  Current Outpatient Medications:  .  amphetamine-dextroamphetamine (ADDERALL) 5 MG tablet, Take 1 tablet (5 mg total) by mouth daily. Take one or two tablets in the afternoon as needed, Disp: 60 tablet, Rfl: 0 .  diazepam (VALIUM) 2 MG tablet, Take 2 mg by mouth 4 (four) times daily as needed for anxiety., Disp: , Rfl:  .  Dimethyl Fumarate 240 MG CPDR, Take 1 capsule by mouth twice daily, Disp: 60 capsule, Rfl: 11 .  Melatonin 2.5 MG CAPS, Take 2.5 mg by mouth at bedtime., Disp: , Rfl:   PAST MEDICAL HISTORY: Past Medical History:  Diagnosis Date  . Optic neuritis 07/03/2017    PAST SURGICAL HISTORY: Past Surgical History:  Procedure Laterality Date  . ABDOMINAL HERNIA REPAIR     at the age of 16  . WISDOM TOOTH EXTRACTION      FAMILY HISTORY: Family History  Problem Relation Age of Onset  . Pulmonary fibrosis Father   . Alopecia Brother     SOCIAL HISTORY:  Social History   Socioeconomic History  . Marital status: Married    Spouse name: Not on file  . Number of children: Not on file  . Years of education: Not on file  . Highest education level: Not on file  Occupational History  . Not on file  Tobacco Use  . Smoking status: Current Every Day Smoker    Packs/day: 0.75    Years: 16.00    Pack years: 12.00    Types: Cigarettes  . Smokeless tobacco: Never Used  Vaping Use  . Vaping Use: Never used  Substance and Sexual Activity  . Alcohol use: No  . Drug use: No  . Sexual activity: Never  Other Topics Concern  . Not on file  Social History  Narrative  . Not on file   Social Determinants of Health   Financial Resource Strain: Not on file  Food Insecurity: Not on file  Transportation Needs: Not on file  Physical Activity: Not on file  Stress: Not on file  Social Connections: Not on file  Intimate Partner Violence: Not on file     PHYSICAL EXAM  Vitals:   12/08/20 1036  BP: 107/70  Pulse: 82  Weight: 141 lb (64 kg)  Height: 5\' 8"  (1.727 m)    Body mass index is 21.44 kg/m.   General: The patient is well-developed and well-nourished and in no acute distress  Eyes:  Funduscopic exam shows slight optic nerve pallor OS.  Color vision and VA are symmetric.    Skin: Extremities are without significant edema.  Musculoskeletal:  Back is mildly tender  Neurologic Exam  Mental status: The patient is alert and oriented x 3 at the time of the examination.  The patient has apparent normal recent and remote memory, with an apparently normal attention span and concentration ability.   Speech is normal.  Cranial nerves: Extraocular movements are full. She has a 2+ left APD.   Facial symmetry is present. There is good facial sensation to soft touch bilaterally. Facial strength is normal.  Trapezius and sternocleidomastoid strength is normal. No dysarthria is noted.   No obvious hearing deficits are noted.  Motor:  Muscle bulk is normal.   Tone is normal. Strength is  5 / 5 in all 4 extremities.   Sensory: Sensory testing is intact to pinprick, soft touch and vibration sensation in all 4 extremities except reduced vibrion oin the left leg.     Coordination: Cerebellar testing reveals good finger-nose-finger and heel-to-shin bilaterally.  Gait and station: Station is normal.   Gait is normal. Tandem gait is normal. Romberg is negative.   Reflexes: Deep tendon reflexes are symmetric and normal bilaterally.   Plantar responses are flexor.    DIAGNOSTIC DATA (LABS, IMAGING, TESTING) - I reviewed patient records, labs,  notes, testing and imaging myself where available.  Lab Results  Component Value Date   WBC 9.2 06/09/2020   HGB 16.3 (H) 06/09/2020   HCT 49.5 (H) 06/09/2020   MCV 101 (H) 06/09/2020   PLT 178 06/09/2020      Component Value Date/Time   NA 138 06/09/2020 0859   K 5.4 (H) 06/09/2020 0859   CL 101 06/09/2020 0859   CO2 24 06/09/2020 0859   GLUCOSE 86 06/09/2020 0859   GLUCOSE 115 (H) 07/02/2017 1834   BUN 11 06/09/2020 0859   CREATININE 0.80 06/09/2020 0859   CALCIUM 9.9 06/09/2020 0859   PROT 7.5 06/09/2020 0859   ALBUMIN 5.3 (H) 06/09/2020 0859   AST 11 06/09/2020 0859   ALT 9 06/09/2020 0859   ALKPHOS 55 06/09/2020 0859   BILITOT 0.3 06/09/2020 0859   GFRNONAA 95 06/09/2020 0859   GFRAA 110 06/09/2020 0859       ASSESSMENT AND PLAN  Multiple sclerosis (HCC)  Chronic fatigue  Optic neuritis due to multiple sclerosis (HCC)  Attention deficit disorder, unspecified hyperactivity presence   1.  Continue DMF, check labs.   Last MRI showed one new focus.   We need to check MRI brain to determine if subclinical progression and consider a different DMT if more is seen 2.   MRI lumbar for continued LBP after her fall 3.   Change stimulant to 5 mg 3 times a day rather than XR plus IR.   4.   rtc 6 months.  Biagio Snelson A. Epimenio Foot, MD, PhD, [place] 12/08/2020, 11:07 AM Certified in Neurology, Clinical Neurophysiology, Sleep Medicine, Pain Medicine and Neuroimaging  [facility] [address] [place], [place] [address] [phone]

## 2020-12-09 ENCOUNTER — Telehealth: Payer: Self-pay | Admitting: Neurology

## 2020-12-09 LAB — CBC WITH DIFFERENTIAL/PLATELET
Basophils Absolute: 0 10*3/uL (ref 0.0–0.2)
Basos: 1 %
EOS (ABSOLUTE): 0.1 10*3/uL (ref 0.0–0.4)
Eos: 1 %
Hematocrit: 43.2 % (ref 34.0–46.6)
Hemoglobin: 14.3 g/dL (ref 11.1–15.9)
Immature Grans (Abs): 0 10*3/uL (ref 0.0–0.1)
Immature Granulocytes: 0 %
Lymphocytes Absolute: 1.6 10*3/uL (ref 0.7–3.1)
Lymphs: 20 %
MCH: 32.4 pg (ref 26.6–33.0)
MCHC: 33.1 g/dL (ref 31.5–35.7)
MCV: 98 fL — ABNORMAL HIGH (ref 79–97)
Monocytes Absolute: 0.7 10*3/uL (ref 0.1–0.9)
Monocytes: 9 %
Neutrophils Absolute: 5.7 10*3/uL (ref 1.4–7.0)
Neutrophils: 69 %
Platelets: 149 10*3/uL — ABNORMAL LOW (ref 150–450)
RBC: 4.41 x10E6/uL (ref 3.77–5.28)
RDW: 12 % (ref 11.7–15.4)
WBC: 8.1 10*3/uL (ref 3.4–10.8)

## 2020-12-09 NOTE — Telephone Encounter (Signed)
[employer] Berkley Harvey: [id number] (exp. 12/09/20 to 06/06/21) order sent to GI. They will reach out to the patient to schedule.

## 2020-12-22 ENCOUNTER — Ambulatory Visit
Admission: RE | Admit: 2020-12-22 | Discharge: 2020-12-22 | Disposition: A | Discharge status: Home / Self Care | Payer: BC Managed Care – PPO | Source: Ambulatory Visit | Attending: Neurology | Admitting: Neurology

## 2020-12-22 ENCOUNTER — Other Ambulatory Visit: Payer: Self-pay

## 2020-12-22 DIAGNOSIS — G35 Multiple sclerosis: Secondary | ICD-10-CM

## 2020-12-22 DIAGNOSIS — M545 Low back pain, unspecified: Secondary | ICD-10-CM

## 2020-12-22 DIAGNOSIS — Z298 Encounter for other specified prophylactic measures: Secondary | ICD-10-CM

## 2020-12-22 DIAGNOSIS — W19XXXD Unspecified fall, subsequent encounter: Secondary | ICD-10-CM

## 2020-12-22 MED ORDER — GADOBENATE DIMEGLUMINE 529 MG/ML IV SOLN
13.0000 mL | Freq: Once | INTRAVENOUS | Status: AC | PRN
  Administered 2020-12-22: 13 mL via INTRAVENOUS

## 2020-12-26 ENCOUNTER — Telehealth: Payer: Self-pay | Admitting: Neurology

## 2020-12-26 DIAGNOSIS — Z79899 Other long term (current) drug therapy: Secondary | ICD-10-CM

## 2020-12-26 DIAGNOSIS — G35 Multiple sclerosis: Secondary | ICD-10-CM

## 2020-12-26 NOTE — Telephone Encounter (Signed)
I discussed the results of the MRIs.  The MRI of the brain performed 12/22/2020 was compared to the MRI from 08/30/2019.  There is 1 new lesion in the right parietal lobe.  Of note, the 2021 MRI had also shown 1 new lesion that had not been present on the previous MRI.  This implies that she is having some breakthrough activity.  We discussed options and I recommend that she switch to a different medication.  She is currently on dimethyl fumarate.  We discussed the S1 P receptor modulators, Ocrevus and Tysabri and Mavenclad.  She would prefer to do one of the IV medications.  We will check blood work for all 4 of these possibilities and go over the results when we get the results.  The MRI of the lumbar spine was normal.

## 2020-12-28 ENCOUNTER — Other Ambulatory Visit (INDEPENDENT_AMBULATORY_CARE_PROVIDER_SITE_OTHER): Payer: Self-pay

## 2020-12-28 ENCOUNTER — Telehealth: Payer: Self-pay | Admitting: *Deleted

## 2020-12-28 DIAGNOSIS — G35 Multiple sclerosis: Secondary | ICD-10-CM

## 2020-12-28 DIAGNOSIS — Z0289 Encounter for other administrative examinations: Secondary | ICD-10-CM

## 2020-12-28 DIAGNOSIS — Z79899 Other long term (current) drug therapy: Secondary | ICD-10-CM

## 2020-12-28 NOTE — Telephone Encounter (Signed)
Placed JCV lab in [facility] lock box for routine lab pick up. Results pending. 

## 2021-01-01 LAB — HEPATIC FUNCTION PANEL
ALT: 7 IU/L (ref 0–32)
AST: 12 IU/L (ref 0–40)
Albumin: 4.6 g/dL (ref 3.8–4.8)
Alkaline Phosphatase: 58 IU/L (ref 44–121)
Bilirubin Total: 0.4 mg/dL (ref 0.0–1.2)
Bilirubin, Direct: 0.13 mg/dL (ref 0.00–0.40)
Total Protein: 6.7 g/dL (ref 6.0–8.5)

## 2021-01-01 LAB — QUANTIFERON-TB GOLD PLUS
QuantiFERON Mitogen Value: 4.1 IU/mL
QuantiFERON Nil Value: 0 IU/mL
QuantiFERON TB1 Ag Value: 0 IU/mL
QuantiFERON TB2 Ag Value: 0 IU/mL
QuantiFERON-TB Gold Plus: NEGATIVE

## 2021-01-01 LAB — HEPATITIS C ANTIBODY: Hep C Virus Ab: <0.1 s/co ratio (ref 0.0–0.9)

## 2021-01-01 LAB — VARICELLA ZOSTER ANTIBODY, IGG: Varicella zoster IgG: 1095 index (ref >165)

## 2021-01-01 LAB — HEPATITIS B SURFACE ANTIBODY,QUALITATIVE: Hep B Surface Ab, Qual: NONREACTIVE

## 2021-01-01 LAB — HEPATITIS B SURFACE ANTIGEN: Hepatitis B Surface Ag: NEGATIVE

## 2021-01-01 LAB — HEPATITIS B CORE ANTIBODY, TOTAL: Hep B Core Total Ab: NEGATIVE

## 2021-01-01 LAB — HIV ANTIBODY (ROUTINE TESTING W REFLEX): HIV Screen 4th Generation wRfx: NONREACTIVE

## 2021-01-04 NOTE — Telephone Encounter (Signed)
JCV ab drawn on 12/28/20 negative, index: 0.15

## 2021-01-06 ENCOUNTER — Telehealth: Payer: Self-pay | Admitting: Neurology

## 2021-01-06 NOTE — Telephone Encounter (Signed)
I spoke with Tamara Booth about the laboratory results.  She would be fine to proceed with either Tysabri (she is JCV antibody negative) or Ocrevus (hepatitis B, HIV and TB labs were normal)  We again discussed the medications and she will think about this over the weekend and let us know next week which of those 2 medications she would prefer.

## 2021-01-18 NOTE — Telephone Encounter (Signed)
She got back to [place] that she would prefer Ocrevus.  We will have her sign a service request form.  If her financial responsibility would be much higher for Ocrevus than Tysabri, she would prefer to go on Tysabri instead.

## 2021-01-23 ENCOUNTER — Other Ambulatory Visit (INDEPENDENT_AMBULATORY_CARE_PROVIDER_SITE_OTHER): Payer: BC Managed Care – PPO

## 2021-01-23 ENCOUNTER — Other Ambulatory Visit: Payer: Self-pay

## 2021-01-23 DIAGNOSIS — Z79899 Other long term (current) drug therapy: Secondary | ICD-10-CM

## 2021-01-23 DIAGNOSIS — G35 Multiple sclerosis: Secondary | ICD-10-CM

## 2021-01-23 DIAGNOSIS — Z0289 Encounter for other administrative examinations: Secondary | ICD-10-CM

## 2021-01-23 NOTE — Telephone Encounter (Signed)
Received Ocrevus order and start form from Dr. Epimenio Foot.   Referral given to [facility].  Copy of order and start form sent to MR for scanning.

## 2021-01-24 LAB — IGG, IGA, IGM
IgA/Immunoglobulin A, Serum: 124 mg/dL (ref 87–352)
IgG (Immunoglobin G), Serum: 695 mg/dL (ref 586–1602)
IgM (Immunoglobulin M), Srm: 181 mg/dL (ref 26–217)

## 2021-01-25 ENCOUNTER — Telehealth: Payer: Self-pay | Admitting: *Deleted

## 2021-01-25 NOTE — Telephone Encounter (Signed)
-----   Message from Asa Lente, MD sent at 01/24/2021  5:15 PM EDT ----- Please let [facility] know the IgG and IgM were normal so she should be fine to get started on Ocrevus

## 2021-01-31 NOTE — Telephone Encounter (Signed)
I spoke with Dr. Epimenio Foot.  He would like her to have a 2-week washout from dimethyl fumarate before her first Ocrevus infusion.  I will send her a MyChart message.

## 2021-03-06 NOTE — Addendum Note (Signed)
Addended by: Geronimo Running A on: 03/06/2021 07:14 AM   Modules accepted: Orders

## 2021-03-20 ENCOUNTER — Other Ambulatory Visit: Payer: Self-pay

## 2021-03-20 DIAGNOSIS — R5382 Chronic fatigue, unspecified: Secondary | ICD-10-CM

## 2021-03-21 ENCOUNTER — Encounter: Payer: Self-pay | Admitting: Neurology

## 2021-03-21 ENCOUNTER — Other Ambulatory Visit: Payer: Self-pay | Admitting: Neurology

## 2021-03-21 MED ORDER — AMPHETAMINE-DEXTROAMPHETAMINE 5 MG PO TABS: ORAL_TABLET | ORAL | 0 refills | Status: DC

## 2021-05-30 NOTE — Patient Instructions (Signed)
Below is our plan:  We will update labs. I am sending in a referral to dermatology for eval of skin sensitivity. I have also called in prednisone and hydroxyzine as discussed. Diflucan for yeast infection prevention.   Please make sure you are staying well hydrated. I recommend 50-60 ounces daily. Well balanced diet and regular exercise encouraged. Consistent sleep schedule with 6-8 hours recommended.   Please continue follow up with care team as directed.   Follow up with Dr Epimenio Foot in 4 months   You may receive a survey regarding today's visit. I encourage you to leave honest feed back as I do use this information to improve patient care. Thank you for seeing me today!

## 2021-05-30 NOTE — Progress Notes (Signed)
Chief Complaint  Patient presents with   Follow-up    RM 2, alone. Last seen 12/08/20. On Ocrevus for MS. Starting itching on face about a month after Ocrevus. OTC allergy meds/lotions/oatmeal bath ineffective. This is new since being on Ocrevus. Has never had this problem before. Itching/burning now on her scalp. Had her mother check for lice/none seen. Feels like she got a chemical peel on her face.    HISTORY OF PRESENT ILLNESS: 05/31/21 ALL: Tamara Booth returns for follow up for RRMS. She was switched from DMF to Ocrevus based on new activity on MRI over past two years. First Ocrevus infusion was 02/2021, second in 03/2021. Next scheduled for 09/2021.   She is doing well from MS perspective. No new or exacerbating symptoms. She walks without difficulty. No falls or changes in gait. She remains active. She recently finished renovating her home. No vision, bowel or bladder changes.   She reports that she stared having a raw sensation of the nose, cheeks and chin 2-3 weeks following second infusion of Ocrevus. She thought it was related to wearing KN95 mask. She stopped wearing the KN95 mask but symptoms did not improve. In fact, they continue to worsen. She feels skin is sun burnt. She feels that skin is very itchy. No blisters or obvious rash. She takes Benadryl daily. She has tried multiple complementary treatments but feels nothing helps. She reports yeast infection with steroids.   She continues Adderall, usually twice daily. She doesn't like how she feels on it but it helps boost energy. XR did not work well for her. She is sleeping well. She has been waking up itching recently with skin changes. Mood is good.   06/09/2020 ALL:  Tamara Booth is a 35 y.o. female here today for follow up for RRMS follow up. She continues dimethyl fumerate started in 08/2019. She is tolerating it fairly well. She does have occasional flushing. She was participant in [employer] research study, completed in 08/2019.    She continues Adderall XR 15 mg daily and IR 5mg  as needed for MS fatigue and ADD. She reports that pain and fatigue are worse with bad weather. She has stiffness and generalized pain with rainy days. She does not usually take anything for pain. She may take ibuprofen from time to time. She also uses a TENS unit.   She is sleeping well. She does take melatonin 2.5mg  nightly for sleep and anxiety. Mood is stable.   She has noticed swelling and tenderness of her right fifth digit at PIP joint. She has not found obvious triggers. She has not taken any medication for pain. She was positive Lyme ab in 12/2018.    HISTORY (copied from Dr 01/2019 note on 07/10/2017)  I had the pleasure seeing you patient, Tamara Booth, at [facility] for neurologic consultation regarding her recent diagnosis of MS following an episode of left optic neuritis.   On 06/28/2017, she felt that there was something in her left eye when she woke up. As the day went on she began to note more difficulty with her vision and this worsened further over the next couple days until she was unable to see. A couple days later she presented to the [facility] MRI was consistent with left optic neuritis and also showed other changes consistent with MS. She received 5 days of IV Solu-Medrol.   Her last day of steroid was 07/07/2017.   Over the past couple days she is noting that she is  able to see a little vision on the top and bottom of the left visual field nothing in the middle except for bright lights.   She cannot see any colors out of that eye. She denies other neurologic symptoms. Specifically, she has not noted any problems with gait, balance, strength, sensation, or bladder function. She notes some fatigue but feels that she does about the same as other people she knows. She sometimes has insomnia and takes melatonin when necessary. She denies any problems with cognition or mood.   Her brother has alopecia.  No  FH of MS    I personally reviewed the MRI of the brain performed 07/03/2017. It shows an enhancing lesion in the left optic nerve just distal to the chiasm. There are multiple T2/FLAIR hyperintense foci in the periventricular, juxtacortical deep white matter of the hemispheres. There are also foci in the pons, left middle cerebellar peduncle, left cerebellar hemisphere and left thalamus  Two of the lesions enhance,  one in the right juxtacortical white matter and another in the left thalamus.  There is a subtle focus at the cervicomedullary junction on the sagittal FLAIR images of the brain that is not seen on the T2-weighted images of the cervical spine performed last week.    REVIEW OF SYSTEMS: Out of a complete 14 system review of symptoms, the patient complains only of the following symptoms, fatigue, anxiety, skin sensitivity, itching and all other reviewed systems are negative.   ALLERGIES: Allergies  Allergen Reactions   Vicodin [Hydrocodone-Acetaminophen] Shortness Of Breath and Itching     HOME MEDICATIONS: Outpatient Medications Prior to Visit  Medication Sig Dispense Refill   amphetamine-dextroamphetamine (ADDERALL) 5 MG tablet Take two tablets in the morning and one in the afternoon 270 tablet 0   Melatonin 2.5 MG CAPS Take 2.5 mg by mouth at bedtime.     ocrelizumab 600 mg in sodium chloride 0.9 % 500 mL Inject 600 mg into the vein every 6 (six) months.     diazepam (VALIUM) 2 MG tablet Take 2 mg by mouth 4 (four) times daily as needed for anxiety.     No facility-administered medications prior to visit.     PAST MEDICAL HISTORY: Past Medical History:  Diagnosis Date   Optic neuritis 07/03/2017     PAST SURGICAL HISTORY: Past Surgical History:  Procedure Laterality Date   ABDOMINAL HERNIA REPAIR     at the age of 44   WISDOM TOOTH EXTRACTION       FAMILY HISTORY: Family History  Problem Relation Age of Onset   Pulmonary fibrosis Father    Alopecia Brother       SOCIAL HISTORY: Social History   Socioeconomic History   Marital status: Married    Spouse name: Not on file   Number of children: Not on file   Years of education: Not on file   Highest education level: Not on file  Occupational History   Not on file  Tobacco Use   Smoking status: Every Day    Packs/day: 0.75    Years: 16.00    Pack years: 12.00    Types: Cigarettes   Smokeless tobacco: Never  Vaping Use   Vaping Use: Never used  Substance and Sexual Activity   Alcohol use: No   Drug use: No   Sexual activity: Never  Other Topics Concern   Not on file  Social History Narrative   Not on file   Social Determinants of Corporate investment banker  Strain: Not on file  Food Insecurity: Not on file  Transportation Needs: Not on file  Physical Activity: Not on file  Stress: Not on file  Social Connections: Not on file  Intimate Partner Violence: Not on file      PHYSICAL EXAM  Vitals:   05/31/21 0849  BP: 115/71  Pulse: 73  Weight: 149 lb 8 oz (67.8 kg)  Height: 5\' 8"  (1.727 m)    Body mass index is 22.73 kg/m.   Generalized: Well developed, in no acute distress   Neurological examination  Mentation: Alert oriented to time, place, history taking. Follows all commands speech and language fluent Cranial nerve II-XII: Pupils were equal round reactive to light. Extraocular movements were full, visual field were full on confrontational test. Facial sensation and strength were normal. Head turning and shoulder shrug  were normal and symmetric. Motor: The motor testing reveals 5 over 5 strength of all 4 extremities. Good symmetric motor tone is noted throughout. Very mild edema and tenderness noted of right fifth PIP joint. No signs of infection noted.  Sensory: Sensory testing is intact to soft touch on all 4 extremities. No evidence of extinction is noted.  Coordination: Cerebellar testing reveals good finger-nose-finger and heel-to-shin bilaterally.  Gait  and station: Gait is normal.  Reflexes: Deep tendon reflexes are symmetric and normal bilaterally.   Skin: erythema and dryness noted mostly under eyes and around boarder of face. Few tiny pustules noted throughout face, more so on left forehead. No vesicular lesions, no obvious rashes, no signs of infection.      DIAGNOSTIC DATA (LABS, IMAGING, TESTING) - I reviewed patient records, labs, notes, testing and imaging myself where available.  Lab Results  Component Value Date   WBC 8.1 12/08/2020   HGB 14.3 12/08/2020   HCT 43.2 12/08/2020   MCV 98 (H) 12/08/2020   PLT 149 (L) 12/08/2020      Component Value Date/Time   NA 138 06/09/2020 0859   K 5.4 (H) 06/09/2020 0859   CL 101 06/09/2020 0859   CO2 24 06/09/2020 0859   GLUCOSE 86 06/09/2020 0859   GLUCOSE 115 (H) 07/02/2017 1834   BUN 11 06/09/2020 0859   CREATININE 0.80 06/09/2020 0859   CALCIUM 9.9 06/09/2020 0859   PROT 6.7 12/28/2020 0842   ALBUMIN 4.6 12/28/2020 0842   AST 12 12/28/2020 0842   ALT 7 12/28/2020 0842   ALKPHOS 58 12/28/2020 0842   BILITOT 0.4 12/28/2020 0842   GFRNONAA 95 06/09/2020 0859   GFRAA 110 06/09/2020 0859   No results found for: CHOL, HDL, LDLCALC, LDLDIRECT, TRIG, CHOLHDL No results found for: 06/11/2020 No results found for: VITAMINB12 No results found for: TSH    ASSESSMENT AND PLAN  35 y.o. year old female  has a past medical history of Optic neuritis (07/03/2017). here with   Multiple sclerosis (HCC) - Plan: CBC with Differential/Platelets, IgG, IgA, IgM, Ambulatory referral to Dermatology  High risk medication use - Plan: CBC with Differential/Platelets, IgG, IgA, IgM, Ambulatory referral to Dermatology  Chronic fatigue  Attention deficit disorder, unspecified hyperactivity presence  Dermatitis - Plan: Ambulatory referral to Dermatology   Tamara Booth is doing well from an MS standpoint. She has had some significant skin sensitivity and puritis over the past 1-2 months. Symptoms  started 2-3 weeks following second course of Ocrevus 1/2 dose and has continued to worsen. Unusual presentation for Ocrevus induced reaction. No signs of HSV, zoster or infection. We have consulted with Dr Clearnce Hasten. I  will update lab work today. We will have her start prednisone taper and hydroxyzine for symptom management. I will refer her to dermatology for an evaluation to ensure no other etiology. She will continue Adderall IR 5 mg up to three times daily as needed.Healthy lifestyle habits encouraged.  She will follow-up with Dr. Epimenio Foot in 6 months, sooner if needed.  She verbalizes understanding and agreement with this plan.   Shawnie Dapper, MSN, FNP-C 05/31/2021, 9:56 AM  [facility] [address] [place], [place] [address] [phone]

## 2021-05-31 ENCOUNTER — Ambulatory Visit: Payer: BC Managed Care – PPO | Admitting: Family Medicine

## 2021-05-31 ENCOUNTER — Encounter: Payer: Self-pay | Admitting: Family Medicine

## 2021-05-31 VITALS — BP 115/71 | HR 73 | Ht 68.0 in | Wt 149.5 lb

## 2021-05-31 DIAGNOSIS — R5382 Chronic fatigue, unspecified: Secondary | ICD-10-CM

## 2021-05-31 DIAGNOSIS — F988 Other specified behavioral and emotional disorders with onset usually occurring in childhood and adolescence: Secondary | ICD-10-CM | POA: Diagnosis not present

## 2021-05-31 DIAGNOSIS — G35 Multiple sclerosis: Secondary | ICD-10-CM | POA: Diagnosis not present

## 2021-05-31 DIAGNOSIS — L309 Dermatitis, unspecified: Secondary | ICD-10-CM

## 2021-05-31 DIAGNOSIS — Z79899 Other long term (current) drug therapy: Secondary | ICD-10-CM

## 2021-05-31 MED ORDER — PREDNISONE 10 MG (21) PO TBPK: ORAL_TABLET | ORAL | 0 refills | Status: DC

## 2021-05-31 MED ORDER — FLUCONAZOLE 150 MG PO TABS: 150.0000 mg | ORAL_TABLET | Freq: Every day | ORAL | 0 refills | Status: DC

## 2021-05-31 MED ORDER — HYDROXYZINE HCL 10 MG PO TABS: 10.0000 mg | ORAL_TABLET | Freq: Three times a day (TID) | ORAL | 0 refills | Status: AC | PRN

## 2021-06-01 LAB — CBC WITH DIFFERENTIAL/PLATELET
Basophils Absolute: 0 10*3/uL (ref 0.0–0.2)
Basos: 0 %
EOS (ABSOLUTE): 0.1 10*3/uL (ref 0.0–0.4)
Eos: 1 %
Hematocrit: 43.5 % (ref 34.0–46.6)
Hemoglobin: 15.2 g/dL (ref 11.1–15.9)
Immature Grans (Abs): 0 10*3/uL (ref 0.0–0.1)
Immature Granulocytes: 0 %
Lymphocytes Absolute: 1.3 10*3/uL (ref 0.7–3.1)
Lymphs: 17 %
MCH: 33.4 pg — ABNORMAL HIGH (ref 26.6–33.0)
MCHC: 34.9 g/dL (ref 31.5–35.7)
MCV: 96 fL (ref 79–97)
Monocytes Absolute: 0.8 10*3/uL (ref 0.1–0.9)
Monocytes: 11 %
Neutrophils Absolute: 5.5 10*3/uL (ref 1.4–7.0)
Neutrophils: 71 %
Platelets: 188 10*3/uL (ref 150–450)
RBC: 4.55 x10E6/uL (ref 3.77–5.28)
RDW: 11.8 % (ref 11.7–15.4)
WBC: 7.7 10*3/uL (ref 3.4–10.8)

## 2021-06-01 LAB — IGG, IGA, IGM
IgA/Immunoglobulin A, Serum: 137 mg/dL (ref 87–352)
IgG (Immunoglobin G), Serum: 775 mg/dL (ref 586–1602)
IgM (Immunoglobulin M), Srm: 180 mg/dL (ref 26–217)

## 2021-06-12 ENCOUNTER — Encounter: Payer: Self-pay | Admitting: Family Medicine

## 2021-06-27 ENCOUNTER — Other Ambulatory Visit: Payer: Self-pay

## 2021-06-27 DIAGNOSIS — R5382 Chronic fatigue, unspecified: Secondary | ICD-10-CM

## 2021-06-27 MED ORDER — AMPHETAMINE-DEXTROAMPHETAMINE 5 MG PO TABS: ORAL_TABLET | ORAL | 0 refills | Status: DC

## 2021-06-27 NOTE — Telephone Encounter (Signed)
Received refill request for Adderall.  Last OV was on 05/31/21.  Next OV is scheduled for 10/23/21 .  Last RX was written on 03/21/21 for 270 tabs.   [place] Drug Database has been reviewed.

## 2021-06-29 ENCOUNTER — Encounter: Payer: Self-pay | Admitting: Neurology

## 2021-06-29 ENCOUNTER — Other Ambulatory Visit: Payer: Self-pay | Admitting: *Deleted

## 2021-06-29 DIAGNOSIS — L309 Dermatitis, unspecified: Secondary | ICD-10-CM

## 2021-07-03 NOTE — Telephone Encounter (Signed)
Referral has been sent to [facility]. Phone: [phone].

## 2021-07-10 ENCOUNTER — Other Ambulatory Visit: Payer: Self-pay | Admitting: Neurology

## 2021-07-10 DIAGNOSIS — R5382 Chronic fatigue, unspecified: Secondary | ICD-10-CM

## 2021-07-10 MED ORDER — AMPHETAMINE-DEXTROAMPHETAMINE 5 MG PO TABS: ORAL_TABLET | ORAL | 0 refills | Status: DC

## 2021-07-10 NOTE — Telephone Encounter (Signed)
Received refill request for Adderall.  Last OV was on 05/31/21.  Next OV is scheduled for 10/23/21 .  Last RX was written on 03/21/21 for 270 tabs.   [place] Drug Database has been reviewed.  

## 2021-07-17 ENCOUNTER — Encounter: Payer: Self-pay | Admitting: Neurology

## 2021-07-18 NOTE — Telephone Encounter (Signed)
I spoke to her about her vision.  She is doing better.  She went to ophthalmology and was found to have a scratched cornea.

## 2021-07-19 ENCOUNTER — Encounter: Payer: Self-pay | Admitting: Neurology

## 2021-07-20 ENCOUNTER — Other Ambulatory Visit: Payer: Self-pay | Admitting: *Deleted

## 2021-07-20 DIAGNOSIS — R5382 Chronic fatigue, unspecified: Secondary | ICD-10-CM

## 2021-07-20 MED ORDER — AMPHETAMINE-DEXTROAMPHETAMINE 5 MG PO TABS: ORAL_TABLET | ORAL | 0 refills | Status: DC

## 2021-08-03 ENCOUNTER — Ambulatory Visit: Payer: BC Managed Care – PPO | Admitting: Family Medicine

## 2021-08-29 ENCOUNTER — Other Ambulatory Visit: Payer: Self-pay | Admitting: Neurology

## 2021-08-29 DIAGNOSIS — R5382 Chronic fatigue, unspecified: Secondary | ICD-10-CM

## 2021-08-29 MED ORDER — AMPHETAMINE-DEXTROAMPHETAMINE 5 MG PO TABS: ORAL_TABLET | ORAL | 0 refills | Status: DC

## 2021-08-30 ENCOUNTER — Encounter: Payer: Self-pay | Admitting: Neurology

## 2021-08-30 ENCOUNTER — Other Ambulatory Visit: Payer: Self-pay | Admitting: Family Medicine

## 2021-08-30 DIAGNOSIS — R5382 Chronic fatigue, unspecified: Secondary | ICD-10-CM

## 2021-08-30 MED ORDER — AMPHETAMINE-DEXTROAMPHETAMINE 5 MG PO TABS: ORAL_TABLET | ORAL | 0 refills | Status: DC

## 2021-08-30 NOTE — Telephone Encounter (Signed)
Pt is requesting a refill for  amphetamine-dextroamphetamine (ADDERALL) 5 MG tablet.  Pharmacy: [id number] Pt confirmed no changes to insurance with New Year

## 2021-09-28 ENCOUNTER — Other Ambulatory Visit: Payer: Self-pay | Admitting: Neurology

## 2021-09-28 ENCOUNTER — Encounter: Payer: Self-pay | Admitting: Neurology

## 2021-09-28 DIAGNOSIS — R5382 Chronic fatigue, unspecified: Secondary | ICD-10-CM

## 2021-09-28 MED ORDER — AMPHETAMINE-DEXTROAMPHETAMINE 5 MG PO TABS: ORAL_TABLET | ORAL | 0 refills | Status: DC

## 2021-10-23 ENCOUNTER — Ambulatory Visit: Payer: BC Managed Care – PPO | Admitting: Neurology

## 2021-10-23 ENCOUNTER — Encounter: Payer: Self-pay | Admitting: Neurology

## 2021-10-23 VITALS — BP 111/76 | HR 76 | Ht 68.0 in | Wt 161.0 lb

## 2021-10-23 DIAGNOSIS — F988 Other specified behavioral and emotional disorders with onset usually occurring in childhood and adolescence: Secondary | ICD-10-CM | POA: Diagnosis not present

## 2021-10-23 DIAGNOSIS — G35 Multiple sclerosis: Secondary | ICD-10-CM

## 2021-10-23 DIAGNOSIS — Z79899 Other long term (current) drug therapy: Secondary | ICD-10-CM | POA: Diagnosis not present

## 2021-10-23 DIAGNOSIS — R5382 Chronic fatigue, unspecified: Secondary | ICD-10-CM | POA: Diagnosis not present

## 2021-10-23 DIAGNOSIS — G4700 Insomnia, unspecified: Secondary | ICD-10-CM

## 2021-10-23 MED ORDER — TRAZODONE HCL 50 MG PO TABS: 50.0000 mg | ORAL_TABLET | Freq: Every day | ORAL | 11 refills | Status: DC

## 2021-10-23 MED ORDER — AMPHETAMINE-DEXTROAMPHETAMINE 10 MG PO TABS: 10.0000 mg | ORAL_TABLET | Freq: Every day | ORAL | 0 refills | Status: DC

## 2021-10-23 NOTE — Progress Notes (Signed)
[facility]  PATIENT: Tamara Booth DOB: 07-27-1986  REFERRING DOCTOR OR PCP:  Juluis Rainier SOURCE: Patient, hospital records, imaging and lab reports, MRI images on PACS  _________________________________   HISTORICAL  CHIEF COMPLAINT:  Chief Complaint  Patient presents with   Follow-up    RM 2, alone. Last seen 05/31/21. On Ocrevus for MS. Tolerating ok.    HISTORY OF PRESENT ILLNESS:   Tamara Booth is a 36 y.o. woman diagnosed with MS following an episode of left optic neuritis in 2018.  Update 10/23/2021: She is on Ocrevus, last infusion 09/2021 and is tolerating it well.    She notes some weight gain since starting (25 pounds).   Previously she was on Vumerity  and then DMF.      She feels neurologically stable with no exacerbations.  Specifically, she has not noted any problems with gait, balance, strength, sensation, or bladder function. She no longer gets hand tingling.    Vision is fine.  She notes some fatigue but feels that she does about the same as other people she knows.   She has had more onset and maintenance insomnia.   She takes Zzzquil (contains diphenhydramine 50 mg) with mild benefit.  Mood and cognition are doing well.  Her MS related attention deficit is helped by Adderall.  It used to help her more than it does now and we discussed changes in dose    MS HISTORY: On 06/28/2017, she felt that there was something in her left eye when she woke up. As the day went on she began to note more difficulty with her vision and this worsened further over the next couple days until she was unable to see. A couple days later she presented to the [facility] MRI was consistent with left optic neuritis and also showed other changes consistent with MS. She received 5 days of IV Solu-Medrol.  Vision improved.  She entered the [employer]-8700 drug study.  The active medication has since been approved by the FDA as Vumerity.  She tolerated it well after the  first couple days and has not had any exacerbations.    Her brother has alopecia.  No FH of MS   IMAGING: MRI of the brain performed 07/03/2017 shows an enhancing lesion in the left optic nerve just distal to the chiasm. There are multiple T2/FLAIR hyperintense foci in the periventricular, juxtacortical deep white matter of the hemispheres. There are also foci in the pons, left middle cerebellar peduncle, left cerebellar hemisphere and left thalamus  Two of the lesions enhance,  one in the right juxtacortical white matter and another in the left thalamus.  There is a subtle focus at the cervicomedullary junction on the sagittal FLAIR images of the brain that is not seen on the T2-weighted images of the cervical spine performed last week.   MRI of the brain 08/30/2019 showed no enhancing lesion but there was one new lesion in the subcortical right parietal lobe that was not present on the previous MRI from 09/22/2018 consistent with a new MS plaque that occurred over the prior year.   REVIEW OF SYSTEMS: Constitutional: No fevers, chills, sweats, or change in appetite Eyes: No visual changes, double vision, eye pain Ear, nose and throat: No hearing loss, ear pain, nasal congestion, sore throat Cardiovascular: No chest pain, palpitations Respiratory:  No shortness of breath at rest or with exertion.   No wheezes GastrointestinaI: No nausea, vomiting, diarrhea, abdominal pain, fecal incontinence Genitourinary:  No dysuria, urinary  retention or frequency.  No nocturia. Musculoskeletal:  No neck pain, back pain Integumentary: No rash, pruritus, skin lesions Neurological: as above Psychiatric: No depression at this time.  No anxiety Endocrine: No palpitations, diaphoresis, change in appetite, change in weigh or increased thirst Hematologic/Lymphatic:  No anemia, purpura, petechiae. Allergic/Immunologic: No itchy/runny eyes, nasal congestion, recent allergic reactions, rashes  ALLERGIES: Allergies   Allergen Reactions   Vicodin [Hydrocodone-Acetaminophen] Shortness Of Breath and Itching    HOME MEDICATIONS:  Current Outpatient Medications:    amphetamine-dextroamphetamine (ADDERALL) 10 MG tablet, Take 1 tablet (10 mg total) by mouth daily with breakfast., Disp: 60 tablet, Rfl: 0   fluconazole (DIFLUCAN) 150 MG tablet, Take 1 tablet (150 mg total) by mouth daily., Disp: 2 tablet, Rfl: 0   hydrOXYzine (ATARAX/VISTARIL) 10 MG tablet, Take 1 tablet (10 mg total) by mouth 3 (three) times daily as needed., Disp: 30 tablet, Rfl: 0   Melatonin 2.5 MG CAPS, Take 2.5 mg by mouth at bedtime., Disp: , Rfl:    ocrelizumab 600 mg in sodium chloride 0.9 % 500 mL, Inject 600 mg into the vein every 6 (six) months., Disp: , Rfl:    traZODone (DESYREL) 50 MG tablet, Take 1 tablet (50 mg total) by mouth at bedtime., Disp: 60 tablet, Rfl: 11  PAST MEDICAL HISTORY: Past Medical History:  Diagnosis Date   Optic neuritis 07/03/2017    PAST SURGICAL HISTORY: Past Surgical History:  Procedure Laterality Date   ABDOMINAL HERNIA REPAIR     at the age of 53   WISDOM TOOTH EXTRACTION      FAMILY HISTORY: Family History  Problem Relation Age of Onset   Pulmonary fibrosis Father    Alopecia Brother     SOCIAL HISTORY:  Social History   Socioeconomic History   Marital status: Married    Spouse name: Not on file   Number of children: Not on file   Years of education: Not on file   Highest education level: Not on file  Occupational History   Not on file  Tobacco Use   Smoking status: Every Day    Packs/day: 0.75    Years: 16.00    Pack years: 12.00    Types: Cigarettes   Smokeless tobacco: Never  Vaping Use   Vaping Use: Never used  Substance and Sexual Activity   Alcohol use: No   Drug use: No   Sexual activity: Never  Other Topics Concern   Not on file  Social History Narrative   Not on file   Social Determinants of Health   Financial Resource Strain: Not on file  Food  Insecurity: Not on file  Transportation Needs: Not on file  Physical Activity: Not on file  Stress: Not on file  Social Connections: Not on file  Intimate Partner Violence: Not on file     PHYSICAL EXAM  Vitals:   10/23/21 1035  BP: 111/76  Pulse: 76  Weight: 161 lb (73 kg)  Height: 5\' 8"  (1.727 m)    Body mass index is 24.48 kg/m.   General: The patient is well-developed and well-nourished and in no acute distress  Eyes:  Funduscopic exam shows slight optic nerve pallor OS.  Color vision and VA are symmetric.    Skin: Extremities are without significant edema.  Musculoskeletal:  Back is mildly tender  Neurologic Exam  Mental status: The patient is alert and oriented x 3 at the time of the examination. The patient has apparent normal recent and remote memory,  with an apparently normal attention span and concentration ability.   Speech is normal.  Cranial nerves: Extraocular movements are full. She has a 1+ left APD.  Color vision was symmetric.  Facial symmetry is present. There is good facial sensation to soft touch bilaterally. Facial strength is normal.  Trapezius and sternocleidomastoid strength is normal. No dysarthria is noted.   No obvious hearing deficits are noted.  Motor:  Muscle bulk is normal.   Tone is normal. Strength is  5 / 5 in all 4 extremities.   Sensory: Sensory testing is intact to pinprick, soft touch and vibration sensation in all 4 extremities except reduced vibrion oin the left leg.     Coordination: Cerebellar testing reveals good finger-nose-finger and heel-to-shin bilaterally.  Gait and station: Station is normal.   The gait is normal.  Tandem gait is normal.  Romberg is negative.  Reflexes: Deep tendon reflexes are symmetric and normal bilaterally.      DIAGNOSTIC DATA (LABS, IMAGING, TESTING) - I reviewed patient records, labs, notes, testing and imaging myself where available.  Lab Results  Component Value Date   WBC 7.7 05/31/2021    HGB 15.2 05/31/2021   HCT 43.5 05/31/2021   MCV 96 05/31/2021   PLT 188 05/31/2021      Component Value Date/Time   NA 138 06/09/2020 0859   K 5.4 (H) 06/09/2020 0859   CL 101 06/09/2020 0859   CO2 24 06/09/2020 0859   GLUCOSE 86 06/09/2020 0859   GLUCOSE 115 (H) 07/02/2017 1834   BUN 11 06/09/2020 0859   CREATININE 0.80 06/09/2020 0859   CALCIUM 9.9 06/09/2020 0859   PROT 6.7 12/28/2020 0842   ALBUMIN 4.6 12/28/2020 0842   AST 12 12/28/2020 0842   ALT 7 12/28/2020 0842   ALKPHOS 58 12/28/2020 0842   BILITOT 0.4 12/28/2020 0842   GFRNONAA 95 06/09/2020 0859   GFRAA 110 06/09/2020 0859       ASSESSMENT AND PLAN  Multiple sclerosis (HCC) - Plan: CBC with Differential/Platelet, IgG, IgA, IgM  Chronic fatigue  High risk medication use - Plan: CBC with Differential/Platelet, IgG, IgA, IgM  Attention deficit disorder, unspecified hyperactivity presence  Insomnia, unspecified type    1.  Continue Ocrevus, check labs.    check MRI brain later in year to determine if subclinical progression and consider a different DMT if more is seen 2.   Change Adderall to 10 mg 2 times a day   3.   Stay active and exercise as tolerated. 4.   rtc 6 months.  Kha Hari A. Epimenio Foot, MD, PhD, [place] 10/23/2021, 11:12 AM Certified in Neurology, Clinical Neurophysiology, Sleep Medicine, Pain Medicine and Neuroimaging  [facility] [address] [place], [place] [address] [phone]

## 2021-10-24 LAB — CBC WITH DIFFERENTIAL/PLATELET
Basophils Absolute: 0 10*3/uL (ref 0.0–0.2)
Basos: 1 %
EOS (ABSOLUTE): 0.1 10*3/uL (ref 0.0–0.4)
Eos: 1 %
Hematocrit: 41 % (ref 34.0–46.6)
Hemoglobin: 14.2 g/dL (ref 11.1–15.9)
Immature Grans (Abs): 0 10*3/uL (ref 0.0–0.1)
Immature Granulocytes: 0 %
Lymphocytes Absolute: 1 10*3/uL (ref 0.7–3.1)
Lymphs: 16 %
MCH: 32.8 pg (ref 26.6–33.0)
MCHC: 34.6 g/dL (ref 31.5–35.7)
MCV: 95 fL (ref 79–97)
Monocytes Absolute: 0.7 10*3/uL (ref 0.1–0.9)
Monocytes: 10 %
Neutrophils Absolute: 4.8 10*3/uL (ref 1.4–7.0)
Neutrophils: 72 %
Platelets: 180 10*3/uL (ref 150–450)
RBC: 4.33 x10E6/uL (ref 3.77–5.28)
RDW: 11.8 % (ref 11.7–15.4)
WBC: 6.6 10*3/uL (ref 3.4–10.8)

## 2021-10-24 LAB — IGG, IGA, IGM
IgA/Immunoglobulin A, Serum: 118 mg/dL (ref 87–352)
IgG (Immunoglobin G), Serum: 749 mg/dL (ref 586–1602)
IgM (Immunoglobulin M), Srm: 159 mg/dL (ref 26–217)

## 2021-11-08 ENCOUNTER — Encounter: Payer: Self-pay | Admitting: Neurology

## 2021-11-08 ENCOUNTER — Telehealth: Payer: Self-pay | Admitting: Neurology

## 2021-11-08 NOTE — Telephone Encounter (Signed)
Pt has called to report that the request for a refill was submitted too soon for the amphetamine-dextroamphetamine (ADDERALL) 10 MG tablet .  Pt is asking if it can now be resent to [place] [id number]  ?

## 2021-11-08 NOTE — Telephone Encounter (Signed)
DUPLICATE. Please refuse ?

## 2021-11-08 NOTE — Telephone Encounter (Signed)
Called [facility] and they have the previous script from 10/27/2021 on file for the pt. Pharmacist confirmed the medication was in stock and states they will fill for the pt and contact her when ready.  ?

## 2021-12-12 ENCOUNTER — Other Ambulatory Visit: Payer: Self-pay | Admitting: Neurology

## 2021-12-12 NOTE — Telephone Encounter (Signed)
Last OV was on 10/23/21.  ?Next OV is scheduled for 04/30/22.  ?Last RX was written on 11/08/21 for 60 tabs.  ? ?[place] Drug Database has been reviewed.  ?

## 2021-12-13 MED ORDER — AMPHETAMINE-DEXTROAMPHETAMINE 10 MG PO TABS: 10.0000 mg | ORAL_TABLET | Freq: Two times a day (BID) | ORAL | 0 refills | Status: DC

## 2022-01-15 ENCOUNTER — Other Ambulatory Visit: Payer: Self-pay | Admitting: Neurology

## 2022-01-15 MED ORDER — AMPHETAMINE-DEXTROAMPHETAMINE 10 MG PO TABS: 10.0000 mg | ORAL_TABLET | Freq: Two times a day (BID) | ORAL | 0 refills | Status: DC

## 2022-01-15 NOTE — Telephone Encounter (Signed)
Last OV was on 10/23/21.  Next OV is scheduled for 04/30/22.  Last RX was written on 12/14/21 for 60 tabs.    Drug Database has been reviewed.

## 2022-01-17 ENCOUNTER — Other Ambulatory Visit: Payer: Self-pay | Admitting: Neurology

## 2022-01-17 MED ORDER — AMPHETAMINE-DEXTROAMPHETAMINE 10 MG PO TABS: 10.0000 mg | ORAL_TABLET | Freq: Two times a day (BID) | ORAL | 0 refills | Status: DC

## 2022-01-17 NOTE — Telephone Encounter (Signed)
Last OV was on 10/23/21.  Next OV is scheduled for 04/30/22.  Last RX was written on 12/14/21 for 60 tabs.   [place] Drug Database has been reviewed.  

## 2022-02-19 ENCOUNTER — Other Ambulatory Visit: Payer: Self-pay | Admitting: Neurology

## 2022-02-19 MED ORDER — AMPHETAMINE-DEXTROAMPHETAMINE 10 MG PO TABS: 10.0000 mg | ORAL_TABLET | Freq: Two times a day (BID) | ORAL | 0 refills | Status: DC

## 2022-02-19 NOTE — Telephone Encounter (Signed)
Last OV was on 10/23/21.  Next OV is scheduled for 04/30/22.  Last RX was written on 01/17/22 for 60 tabs.   [place] Drug Database has been reviewed.

## 2022-03-21 ENCOUNTER — Other Ambulatory Visit: Payer: Self-pay | Admitting: Neurology

## 2022-03-21 MED ORDER — AMPHETAMINE-DEXTROAMPHETAMINE 10 MG PO TABS: 10.0000 mg | ORAL_TABLET | Freq: Two times a day (BID) | ORAL | 0 refills | Status: DC

## 2022-03-21 NOTE — Telephone Encounter (Signed)
Last OV was on 10/23/21.  Next OV is scheduled for 04/30/22.  Last RX was written on 02/19/22 for 60 tabs.   [place] Drug Database has been reviewed.

## 2022-04-23 ENCOUNTER — Other Ambulatory Visit: Payer: Self-pay | Admitting: Neurology

## 2022-04-25 ENCOUNTER — Encounter: Payer: Self-pay | Admitting: Neurology

## 2022-04-25 MED ORDER — AMPHETAMINE-DEXTROAMPHETAMINE 10 MG PO TABS: 10.0000 mg | ORAL_TABLET | Freq: Two times a day (BID) | ORAL | 0 refills | Status: DC

## 2022-04-30 ENCOUNTER — Encounter: Payer: Self-pay | Admitting: Neurology

## 2022-04-30 ENCOUNTER — Ambulatory Visit: Payer: BC Managed Care – PPO | Admitting: Neurology

## 2022-04-30 VITALS — BP 123/79 | HR 85 | Ht 69.0 in | Wt 158.6 lb

## 2022-04-30 DIAGNOSIS — H469 Unspecified optic neuritis: Secondary | ICD-10-CM

## 2022-04-30 DIAGNOSIS — Z79899 Other long term (current) drug therapy: Secondary | ICD-10-CM | POA: Diagnosis not present

## 2022-04-30 DIAGNOSIS — G35 Multiple sclerosis: Secondary | ICD-10-CM

## 2022-04-30 NOTE — Progress Notes (Signed)
[facility]  PATIENT: Tamara Booth DOB: [birth year]  REFERRING DOCTOR OR PCP:  Leighton Ruff SOURCE: Patient, hospital records, imaging and lab reports, MRI images on PACS  _________________________________   HISTORICAL  CHIEF COMPLAINT:  Chief Complaint  Patient presents with   Follow-up    Pt in rm #2 and alone. Pt state nothing has change since the last visit. Pt state been trying to weight and she has been successful with losing weight.     HISTORY OF PRESENT ILLNESS:   Tamara Booth is a 36 y.o. woman diagnosed with MS following an episode of left optic neuritis in 2018.  Update 04/30/2022 She is on Ocrevus, last infusion 03/2022 and is tolerating it well.    She was able to lose the weight that she had gained the past year..  She is walking more for exercise.   She feels neurologically stable with no exacerbations.  Specifically, she has not noted any problems with gait, balance, strength, sensation, or bladder function. She no longer gets hand tingling.    She uses the bannister on stairs for safety.    Vision is fine.  She notes some fatigue but feels that she does about the same as other people she knows.   She has some sleep maintenance insomnia.   She tried trazodone but had complex sleep walking behavior without recall so stopped.    Mood and cognition are doing well.  Her MS related attention deficit is helped by Adderall 10 mg po 1-2 times a day.     MS HISTORY: On 06/28/2017, she felt that there was something in her left eye when she woke up. As the day went on she began to note more difficulty with her vision and this worsened further over the next couple days until she was unable to see. A couple days later she presented to the [facility] MRI was consistent with left optic neuritis and also showed other changes consistent with MS. She received 5 days of IV Solu-Medrol.  Vision improved.  She entered the [id number]-8700 drug study.  The active  medication has since been approved by the FDA as Vumerity.  She tolerated it well after the first couple days and has not had any exacerbations.    Her brother has alopecia.  No FH of MS   IMAGING: MRI of the brain performed 07/03/2017 shows an enhancing lesion in the left optic nerve just distal to the chiasm. There are multiple T2/FLAIR hyperintense foci in the periventricular, juxtacortical deep white matter of the hemispheres. There are also foci in the pons, left middle cerebellar peduncle, left cerebellar hemisphere and left thalamus  Two of the lesions enhance,  one in the right juxtacortical white matter and another in the left thalamus.  There is a subtle focus at the cervicomedullary junction on the sagittal FLAIR images of the brain that is not seen on the T2-weighted images of the cervical spine performed last week.   MRI of the brain 08/30/2019 showed no enhancing lesion but there was one new lesion in the subcortical right parietal lobe that was not present on the previous MRI from 09/22/2018 consistent with a new MS plaque that occurred over the prior year.   REVIEW OF SYSTEMS: Constitutional: No fevers, chills, sweats, or change in appetite Eyes: No visual changes, double vision, eye pain Ear, nose and throat: No hearing loss, ear pain, nasal congestion, sore throat Cardiovascular: No chest pain, palpitations Respiratory:  No shortness of breath at rest  or with exertion.   No wheezes GastrointestinaI: No nausea, vomiting, diarrhea, abdominal pain, fecal incontinence Genitourinary:  No dysuria, urinary retention or frequency.  No nocturia. Musculoskeletal:  No neck pain, back pain Integumentary: No rash, pruritus, skin lesions Neurological: as above Psychiatric: No depression at this time.  No anxiety Endocrine: No palpitations, diaphoresis, change in appetite, change in weigh or increased thirst Hematologic/Lymphatic:  No anemia, purpura, petechiae. Allergic/Immunologic: No  itchy/runny eyes, nasal congestion, recent allergic reactions, rashes  ALLERGIES: Allergies  Allergen Reactions   Vicodin [Hydrocodone-Acetaminophen] Shortness Of Breath and Itching    HOME MEDICATIONS:  Current Outpatient Medications:    amphetamine-dextroamphetamine (ADDERALL) 10 MG tablet, Take 1 tablet (10 mg total) by mouth 2 (two) times daily., Disp: 60 tablet, Rfl: 0   fluconazole (DIFLUCAN) 150 MG tablet, Take 1 tablet (150 mg total) by mouth daily., Disp: 2 tablet, Rfl: 0   hydrOXYzine (ATARAX/VISTARIL) 10 MG tablet, Take 1 tablet (10 mg total) by mouth 3 (three) times daily as needed., Disp: 30 tablet, Rfl: 0   Melatonin 2.5 MG CAPS, Take 2.5 mg by mouth at bedtime., Disp: , Rfl:    ocrelizumab 600 mg in sodium chloride 0.9 % 500 mL, Inject 600 mg into the vein every 6 (six) months., Disp: , Rfl:   PAST MEDICAL HISTORY: Past Medical History:  Diagnosis Date   Optic neuritis 07/03/2017    PAST SURGICAL HISTORY: Past Surgical History:  Procedure Laterality Date   ABDOMINAL HERNIA REPAIR     at the age of 1   WISDOM TOOTH EXTRACTION      FAMILY HISTORY: Family History  Problem Relation Age of Onset   Pulmonary fibrosis Father    Alopecia Brother     SOCIAL HISTORY:  Social History   Socioeconomic History   Marital status: Married    Spouse name: Not on file   Number of children: Not on file   Years of education: Not on file   Highest education level: Not on file  Occupational History   Not on file  Tobacco Use   Smoking status: Every Day    Packs/day: 0.75    Years: 16.00    Total pack years: 12.00    Types: Cigarettes   Smokeless tobacco: Never  Vaping Use   Vaping Use: Never used  Substance and Sexual Activity   Alcohol use: No   Drug use: No   Sexual activity: Never  Other Topics Concern   Not on file  Social History Narrative   Not on file   Social Determinants of Health   Financial Resource Strain: Not on file  Food Insecurity: Not  on file  Transportation Needs: Not on file  Physical Activity: Not on file  Stress: Not on file  Social Connections: Not on file  Intimate Partner Violence: Not on file     PHYSICAL EXAM  Vitals:   04/30/22 1113  BP: 123/79  Pulse: 85  Weight: 158 lb 9.6 oz (71.9 kg)  Height: 5\' 9"  (1.753 m)    Body mass index is 23.42 kg/m.   General: The patient is well-developed and well-nourished and in no acute distress  Eyes:   Color vision and VA are symmetric.    Skin: Extremities are without significant edema.  Musculoskeletal:  Back is mildly tender  Neurologic Exam  Mental status: The patient is alert and oriented x 3 at the time of the examination. The patient has apparent normal recent and remote memory, with an apparently normal attention  span and concentration ability.   Speech is normal.  Cranial nerves: Extraocular movements are full. She has a 1+ left APD.  Color vision was symmetric.  Facial symmetry is present. There is good facial sensation to soft touch bilaterally. Facial strength is normal.  Trapezius and sternocleidomastoid strength is normal. No dysarthria is noted.   No obvious hearing deficits are noted.  Motor:  Muscle bulk is normal.   Tone is normal. Strength is  5 / 5 in all 4 extremities.   Sensory: Sensory testing is intact to pinprick, soft touch and vibration sensation in all 4 extremities except reduced vibrion oin the left leg.     Coordination: Cerebellar testing reveals good finger-nose-finger and heel-to-shin bilaterally.  Gait and station: Station is normal.   The gait and tandem gait are normal. Reflexes: Deep tendon reflexes are symmetric and normal bilaterally.      DIAGNOSTIC DATA (LABS, IMAGING, TESTING) - I reviewed patient records, labs, notes, testing and imaging myself where available.  Lab Results  Component Value Date   WBC 6.6 10/23/2021   HGB 14.2 10/23/2021   HCT 41.0 10/23/2021   MCV 95 10/23/2021   PLT 180 10/23/2021       Component Value Date/Time   NA 138 06/09/2020 0859   K 5.4 (H) 06/09/2020 0859   CL 101 06/09/2020 0859   CO2 24 06/09/2020 0859   GLUCOSE 86 06/09/2020 0859   GLUCOSE 115 (H) 07/02/2017 1834   BUN 11 06/09/2020 0859   CREATININE 0.80 06/09/2020 0859   CALCIUM 9.9 06/09/2020 0859   PROT 6.7 12/28/2020 0842   ALBUMIN 4.6 12/28/2020 0842   AST 12 12/28/2020 0842   ALT 7 12/28/2020 0842   ALKPHOS 58 12/28/2020 0842   BILITOT 0.4 12/28/2020 0842   GFRNONAA 95 06/09/2020 0859   GFRAA 110 06/09/2020 0859       ASSESSMENT AND PLAN  Multiple sclerosis (HCC) - Plan: CBC with Differential/Platelet, IgG, IgA, IgM  High risk medication use - Plan: CBC with Differential/Platelet, IgG, IgA, IgM  Optic 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  Continue Ocrevus, check labs.    check MRI brain around time of next visit in the spring 2024 to determine if subclinical progression and consider a different DMT if more is seen 2.   Continue Adderall 10 mg up to 2 times a day or as needed 3.   Stay active and exercise as tolerated. 4.   rtc 6 months.  Armando Bukhari A. Epimenio Foot, MD, PhD, [place] 04/30/2022, 3:49 PM Certified in Neurology, Clinical Neurophysiology, Sleep Medicine, Pain Medicine and Neuroimaging  [facility] [address], [address] [place], [place] [address] [phone]

## 2022-05-01 LAB — CBC WITH DIFFERENTIAL/PLATELET
Basophils Absolute: 0 10*3/uL (ref 0.0–0.2)
Basos: 0 %
EOS (ABSOLUTE): 0 10*3/uL (ref 0.0–0.4)
Eos: 1 %
Hematocrit: 41.1 % (ref 34.0–46.6)
Hemoglobin: 14.3 g/dL (ref 11.1–15.9)
Immature Grans (Abs): 0 10*3/uL (ref 0.0–0.1)
Immature Granulocytes: 0 %
Lymphocytes Absolute: 1.5 10*3/uL (ref 0.7–3.1)
Lymphs: 21 %
MCH: 33.7 pg — ABNORMAL HIGH (ref 26.6–33.0)
MCHC: 34.8 g/dL (ref 31.5–35.7)
MCV: 97 fL (ref 79–97)
Monocytes Absolute: 0.7 10*3/uL (ref 0.1–0.9)
Monocytes: 9 %
Neutrophils Absolute: 5.1 10*3/uL (ref 1.4–7.0)
Neutrophils: 69 %
Platelets: 185 10*3/uL (ref 150–450)
RBC: 4.24 x10E6/uL (ref 3.77–5.28)
RDW: 11.9 % (ref 11.7–15.4)
WBC: 7.4 10*3/uL (ref 3.4–10.8)

## 2022-05-01 LAB — IGG, IGA, IGM
IgA/Immunoglobulin A, Serum: 113 mg/dL (ref 87–352)
IgG (Immunoglobin G), Serum: 717 mg/dL (ref 586–1602)
IgM (Immunoglobulin M), Srm: 145 mg/dL (ref 26–217)

## 2022-05-24 ENCOUNTER — Other Ambulatory Visit: Payer: Self-pay | Admitting: Neurology

## 2022-05-24 NOTE — Telephone Encounter (Signed)
Pt last appt was on 9/18 and has an up coming appt on 10/30/22. Pt last refill in the registry was on 9/13. Pt due for refill.

## 2022-05-27 MED ORDER — AMPHETAMINE-DEXTROAMPHETAMINE 10 MG PO TABS: 10.0000 mg | ORAL_TABLET | Freq: Two times a day (BID) | ORAL | 0 refills | Status: DC

## 2022-06-26 ENCOUNTER — Other Ambulatory Visit: Payer: Self-pay | Admitting: Neurology

## 2022-06-26 MED ORDER — AMPHETAMINE-DEXTROAMPHETAMINE 10 MG PO TABS: 10.0000 mg | ORAL_TABLET | Freq: Two times a day (BID) | ORAL | 0 refills | Status: DC

## 2022-07-25 ENCOUNTER — Other Ambulatory Visit: Payer: Self-pay | Admitting: Neurology

## 2022-07-25 ENCOUNTER — Encounter: Payer: Self-pay | Admitting: Neurology

## 2022-07-25 MED ORDER — AMPHETAMINE-DEXTROAMPHETAMINE 10 MG PO TABS: 10.0000 mg | ORAL_TABLET | Freq: Two times a day (BID) | ORAL | 0 refills | Status: DC

## 2022-07-25 MED ORDER — AMPHETAMINE-DEXTROAMPHET ER 15 MG PO CP24: 15.0000 mg | ORAL_CAPSULE | ORAL | 0 refills | Status: DC

## 2022-08-23 ENCOUNTER — Other Ambulatory Visit: Payer: Self-pay | Admitting: Neurology

## 2022-08-23 MED ORDER — AMPHETAMINE-DEXTROAMPHETAMINE 10 MG PO TABS: 10.0000 mg | ORAL_TABLET | Freq: Two times a day (BID) | ORAL | 0 refills | Status: DC

## 2022-08-27 ENCOUNTER — Other Ambulatory Visit: Payer: Self-pay | Admitting: Neurology

## 2022-08-27 MED ORDER — AMPHETAMINE-DEXTROAMPHET ER 15 MG PO CP24: 15.0000 mg | ORAL_CAPSULE | ORAL | 0 refills | Status: DC

## 2022-09-27 ENCOUNTER — Other Ambulatory Visit: Payer: Self-pay | Admitting: Neurology

## 2022-09-27 MED ORDER — AMPHETAMINE-DEXTROAMPHETAMINE 10 MG PO TABS: 10.0000 mg | ORAL_TABLET | Freq: Two times a day (BID) | ORAL | 0 refills | Status: DC

## 2022-09-27 MED ORDER — AMPHETAMINE-DEXTROAMPHET ER 15 MG PO CP24: 15.0000 mg | ORAL_CAPSULE | ORAL | 0 refills | Status: DC

## 2022-09-27 NOTE — Telephone Encounter (Signed)
Last seen on 04/30/2022 Follow up scheduled on 10/30/22  Adderall Xr 15 mg last filled on 08/27/22 # 30 Adderall 10 mg tablet last filled on 06/27/2022 #60 Rx pending to be signed.

## 2022-10-02 ENCOUNTER — Other Ambulatory Visit: Payer: Self-pay

## 2022-10-02 ENCOUNTER — Emergency Department (HOSPITAL_BASED_OUTPATIENT_CLINIC_OR_DEPARTMENT_OTHER): Payer: BC Managed Care – PPO | Admitting: Radiology

## 2022-10-02 ENCOUNTER — Emergency Department (HOSPITAL_BASED_OUTPATIENT_CLINIC_OR_DEPARTMENT_OTHER)
Admission: EM | Admit: 2022-10-02 | Discharge: 2022-10-02 | Disposition: A | Discharge status: Home / Self Care | Payer: BC Managed Care – PPO | Attending: Emergency Medicine | Admitting: Emergency Medicine

## 2022-10-02 ENCOUNTER — Encounter (HOSPITAL_BASED_OUTPATIENT_CLINIC_OR_DEPARTMENT_OTHER): Payer: Self-pay

## 2022-10-02 ENCOUNTER — Telehealth: Payer: Self-pay | Admitting: Neurology

## 2022-10-02 DIAGNOSIS — M79644 Pain in right finger(s): Secondary | ICD-10-CM | POA: Diagnosis not present

## 2022-10-02 DIAGNOSIS — R2232 Localized swelling, mass and lump, left upper limb: Secondary | ICD-10-CM | POA: Insufficient documentation

## 2022-10-02 DIAGNOSIS — M79645 Pain in left finger(s): Secondary | ICD-10-CM | POA: Diagnosis not present

## 2022-10-02 DIAGNOSIS — R2231 Localized swelling, mass and lump, right upper limb: Secondary | ICD-10-CM | POA: Insufficient documentation

## 2022-10-02 DIAGNOSIS — M79641 Pain in right hand: Secondary | ICD-10-CM | POA: Insufficient documentation

## 2022-10-02 DIAGNOSIS — M79642 Pain in left hand: Secondary | ICD-10-CM | POA: Diagnosis not present

## 2022-10-02 LAB — SEDIMENTATION RATE: Sed Rate: 3 mm/hr (ref 0–22)

## 2022-10-02 LAB — C-REACTIVE PROTEIN: CRP: <0.5 mg/dL (ref <1.0)

## 2022-10-02 MED ORDER — METHYLPREDNISOLONE 4 MG PO TBPK: ORAL_TABLET | ORAL | 0 refills | Status: DC

## 2022-10-02 NOTE — Discharge Instructions (Addendum)
You were given a prescription for your medrol dose pack, please take this medication as prescribed.  We obtain lab work today which will be available to you via [facility].  The number to a specialist Dr. Fredna Dow is attached to your chart, if you wish to schedule an appointment.

## 2022-10-02 NOTE — Telephone Encounter (Signed)
I spoke to Tamara Booth and Tamara Booth (emergency department).  Tamara Booth had been sent to the emergency room from urgent care due to swelling and pain of 1 finger on each hand.  She was felt to have tenosynovitis.  Symptoms worsened shortly after her Ocrevus infusion and there was some concern that there could be a connection.  I am not familiar with tenosynovitis or other joint disturbance occurring after Ocrevus.  I am not sure what is causing her symptoms.  I recommended that while she is in the emergency room that she have x-rays of her hands and get blood work for ANA, ESR, CRP and rheumatoid factor.  I also recommended that she have an urgent orthopedic consultation as she may need an injection or other procedure.

## 2022-10-02 NOTE — ED Provider Notes (Signed)
[facility] Provider Note   CSN: [id number] Arrival date & time: 10/02/22  1147     History MS Chief Complaint  Patient presents with   Hand Pain    Tamara Booth is a 37 y.o. female.  37 year old female with a past medical history of MS presents to the ED with a chief complaint of bilateral hand pain for approximately 2 weeks.  Reports receiving her MS infusion on 1 Tuesday, approximately 6 days ago reports she feels like the pain has now gotten worse.  She has not been evaluated by her neurologist who currently follows her for this Dr. Rachel Moulds.  She reports been evaluated at urgent care prior to this, given an injection of Toradol but had improvement in symptoms however swelling still remains.  He was also sent here for an ultrasound to rule out tenosynovitis?.  She has been taking over-the-counter anti-inflammatories for the past 4 days without any improvement in symptoms.  The history is provided by the patient.  Hand Pain       Home Medications Prior to Admission medications   Medication Sig Start Date End Date Taking? Authorizing Provider  methylPREDNISolone (MEDROL DOSEPAK) 4 MG TBPK tablet Please take 6 tablets on day 1, 5 tablets on day two, 4 tablets on day three, 3 tablets on day four, two tablets on day five 10/02/22  Yes Winona Sison, PA-C  amphetamine-dextroamphetamine (ADDERALL XR) 15 MG 24 hr capsule Take 1 capsule by mouth every morning. 09/27/22   Sater, Nanine Means, MD  amphetamine-dextroamphetamine (ADDERALL) 10 MG tablet Take 1 tablet (10 mg total) by mouth 2 (two) times daily. 09/27/22   Sater, Nanine Means, MD  fluconazole (DIFLUCAN) 150 MG tablet Take 1 tablet (150 mg total) by mouth daily. 05/31/21   Lomax, Amy, NP  hydrOXYzine (ATARAX/VISTARIL) 10 MG tablet Take 1 tablet (10 mg total) by mouth 3 (three) times daily as needed. 05/31/21   Lomax, Amy, NP  Melatonin 2.5 MG CAPS Take 2.5 mg by mouth at bedtime.    [provider]  ocrelizumab 600 mg in sodium chloride 0.9 % 500 mL Inject 600 mg into the vein every 6 (six) months.    [provider]      Allergies    Vicodin [hydrocodone-acetaminophen]    Review of Systems   Review of Systems  Constitutional:  Negative for fever.  Musculoskeletal:  Positive for arthralgias and myalgias.    Physical Exam Updated Vital Signs BP 122/88 (BP Location: Left Arm)   Pulse 82   Temp 98.4 F (36.9 C)   Resp 16   Ht '5\' 9"'$  (1.753 m)   Wt 68 kg   SpO2 100%   BMI 22.15 kg/m  Physical Exam Vitals and nursing note reviewed.  Constitutional:      Appearance: Normal appearance.  HENT:     Head: Normocephalic and atraumatic.     Mouth/Throat:     Mouth: Mucous membranes are moist.  Eyes:     Pupils: Pupils are equal, round, and reactive to light.  Cardiovascular:     Rate and Rhythm: Normal rate.     Pulses:          Radial pulses are 2+ on the right side and 2+ on the left side.  Pulmonary:     Effort: Pulmonary effort is normal.     Breath sounds: No wheezing.  Abdominal:     General: Abdomen is flat.  Musculoskeletal:  General: Tenderness present.     Right hand: Tenderness present. Decreased range of motion. Normal strength. Normal sensation. There is no disruption of two-point discrimination. Decreased capillary refill. Normal pulse.     Left hand: Tenderness present. Decreased range of motion. Normal strength. Normal sensation. There is no disruption of two-point discrimination. Decreased capillary refill. Normal pulse.     Cervical back: Normal range of motion and neck supple.     Comments: Pain along the middle right finger and left index finger with limited ROM due to pain.capillary refill is slightly delayed.   Skin:    General: Skin is warm and dry.  Neurological:     Mental Status: She is alert and oriented to person, place, and time.     ED Results / Procedures / Treatments   Labs (all labs ordered are listed,  but only abnormal results are displayed) Labs Reviewed  SEDIMENTATION RATE  C-REACTIVE PROTEIN  ANTINUCLEAR ANTIBODIES, IFA  RHEUMATOID FACTOR    EKG None  Radiology DG Hand Complete Right  Result Date: 10/02/2022 CLINICAL DATA:  Pain EXAM: RIGHT HAND - COMPLETE 3 VIEW COMPARISON:  None Available. FINDINGS: There is no evidence of fracture or dislocation. There is no evidence of arthropathy or other focal bone abnormality. Soft tissues are unremarkable. IMPRESSION: Negative. Electronically Signed   By: Sammie Bench M.D.   On: 10/02/2022 14:43   DG Hand Complete Left  Result Date: 10/02/2022 CLINICAL DATA:  BILATERAL hand pain and swelling, no injury, history multiple sclerosis EXAM: LEFT HAND - COMPLETE 3+ VIEW COMPARISON:  None Available. FINDINGS: Osseous mineralization normal. Joint spaces preserved. No fracture, dislocation, or bone destruction. IMPRESSION: Normal exam. Electronically Signed   By: Lavonia Dana M.D.   On: 10/02/2022 14:43    Procedures Procedures    Medications Ordered in ED Medications - No data to display  ED Course/ Medical Decision Making/ A&P                             Medical Decision Making Amount and/or Complexity of Data Reviewed Labs: ordered. Radiology: ordered.  Risk Prescription drug management.     Presents to the ED with bilateral hand pain that is been ongoing for the past 3 weeks.  Reports symptoms actually were exacerbated after her MS infusion on Valentine's Day approximately 6 days ago.  Continues to have swelling and pain.  Went to urgent care today where she was given IM injection of Toradol with some improvement in her pain.  Was referred here in order to rule out tenosynovitis via ultrasound.  Evaluation she is overall in no acute distress, vitals are within normal limits, she is afebrile.  Radial 2+ pulses to bilateral wrist.  There is diminished strength to the right middle finger and left index finger along with decreased  range of motion due to pain.  Capillary refill is somewhat delayed however reassuring vascular evaluation with good color and sensation throughout.  I discussed with her we will need to discuss case with her specialist along with orthopedics on-call.  1:45 Spoke to ortho on call who reports not likely ortho related but more so medicine, will call specialist for further recommendations.   1:48 PM Call place to Dr. Felecia Shelling, who recommended blood work, not related to the medication. Will need ortho referral. Hand surgery  Dr. Fredna Dow.   CRP, sed rate, ANA will not likely return on today's visit.  Patient's pain appears to be  under control after Toradol.  X-rays did not show any acute finding.  I do feel that patient is hemodynamically stable for outpatient follow-up at this time.  I discussed with her providing her with a prescription for steroids to help with symptoms as recommended also by Dr. Rachel Moulds, she is agreeable to schedule an appoint with orthopedist on an outpatient basis.  Patient is hemodynamically stable for discharge.    Portions of this note were generated with [employer]. Dictation errors may occur despite best attempts at proofreading.   Final Clinical Impression(s) / ED Diagnoses Final diagnoses:  Bilateral hand pain    Rx / DC Orders ED Discharge Orders          Ordered    methylPREDNISolone (MEDROL DOSEPAK) 4 MG TBPK tablet        10/02/22 1557              Janeece Fitting, PA-C 10/02/22 1559    Pattricia Boss, MD 10/08/22 1432

## 2022-10-02 NOTE — ED Triage Notes (Signed)
Pt reports she came from [facility] and was recommended to come to [facility] ED for further eval. Approx 3 weeks ago pt began having pain and swelling in fingers in L hand which she then noticed began in the R as well. Pt states she had infusion done on 2/14 and pain has been intolerable since. IM Toradol admin at [facility]. Hx MS.

## 2022-10-02 NOTE — ED Notes (Signed)
RN provided AVS using Teachback Method. Patient verbalizes understanding of Discharge Instructions. Opportunity for Questioning and Answers were provided by RN. Patient Discharged from ED ambulatory to Home. ° °

## 2022-10-03 LAB — ANTINUCLEAR ANTIBODIES, IFA: ANA Ab, IFA: NEGATIVE

## 2022-10-03 LAB — RHEUMATOID FACTOR: Rheumatoid fact SerPl-aCnc: <10 IU/mL (ref <14.0)

## 2022-10-04 ENCOUNTER — Encounter: Payer: Self-pay | Admitting: Neurology

## 2022-10-29 ENCOUNTER — Other Ambulatory Visit: Payer: Self-pay | Admitting: Neurology

## 2022-10-30 ENCOUNTER — Encounter: Payer: Self-pay | Admitting: Neurology

## 2022-10-30 ENCOUNTER — Other Ambulatory Visit: Payer: Self-pay | Admitting: Neurology

## 2022-10-30 ENCOUNTER — Ambulatory Visit: Payer: BC Managed Care – PPO | Admitting: Neurology

## 2022-10-30 VITALS — BP 124/79 | HR 81 | Ht 69.0 in | Wt 156.0 lb

## 2022-10-30 DIAGNOSIS — Z79899 Other long term (current) drug therapy: Secondary | ICD-10-CM

## 2022-10-30 DIAGNOSIS — G4719 Other hypersomnia: Secondary | ICD-10-CM

## 2022-10-30 DIAGNOSIS — M2540 Effusion, unspecified joint: Secondary | ICD-10-CM | POA: Diagnosis not present

## 2022-10-30 DIAGNOSIS — R0683 Snoring: Secondary | ICD-10-CM

## 2022-10-30 DIAGNOSIS — G35 Multiple sclerosis: Secondary | ICD-10-CM

## 2022-10-30 DIAGNOSIS — M79641 Pain in right hand: Secondary | ICD-10-CM

## 2022-10-30 DIAGNOSIS — E559 Vitamin D deficiency, unspecified: Secondary | ICD-10-CM | POA: Diagnosis not present

## 2022-10-30 DIAGNOSIS — M79642 Pain in left hand: Secondary | ICD-10-CM

## 2022-10-30 MED ORDER — AMPHETAMINE-DEXTROAMPHET ER 15 MG PO CP24: 15.0000 mg | ORAL_CAPSULE | ORAL | 0 refills | Status: DC

## 2022-10-30 MED ORDER — ETODOLAC 400 MG PO TABS: 400.0000 mg | ORAL_TABLET | Freq: Two times a day (BID) | ORAL | 5 refills | Status: AC

## 2022-10-30 MED ORDER — METHYLPREDNISOLONE 4 MG PO TABS: ORAL_TABLET | ORAL | 0 refills | Status: DC

## 2022-10-30 MED ORDER — AMPHETAMINE-DEXTROAMPHETAMINE 10 MG PO TABS: 10.0000 mg | ORAL_TABLET | Freq: Two times a day (BID) | ORAL | 0 refills | Status: DC

## 2022-10-30 NOTE — Progress Notes (Signed)
[facility]  PATIENT: Tamara Booth DOB: [birth year]  REFERRING DOCTOR OR PCP:  Leighton Ruff SOURCE: Patient, hospital records, imaging and lab reports, MRI images on PACS  _________________________________   HISTORICAL  CHIEF COMPLAINT:  Chief Complaint  Patient presents with   Follow-up    RM 10, alone. Ocrevus going well. C/o cramping in her hands. Last Ocrevus in February. Needs refills on adderall.    HISTORY OF PRESENT ILLNESS:   [name] is a 37 y.o. woman diagnosed with MS following an episode of left optic neuritis in 2018.  Update 04/30/2022 She woke up one day in February with bilateral hand pain, in the palm an knuckles.     ANA, ESR, RF and ANA were normal/non-contributary.   There was no trauma.  There I no heat over the joints.    She is on Ocrevus, last infusion 09/2022 and is tolerating it well.    She was able to lose the weight that she had gained the past year..  She is walking more for exercise.   She feels neurologically stable with no exacerbations.  Specifically, she has not noted any problems with gait, balance, strength, sensation, or bladder function. She no longer gets hand tingling.    She uses the bannister on stairs for safety.    Vision is fine.  She notes some fatigue but feels that she does about the same as other people she knows.   She has some sleep maintenance insomnia.   She tried trazodone but had complex sleep walking behavior without recall so stopped.    Mood and cognition are doing well.  Her MS related attention deficit is helped by Adderall 10 mg po 1-2 times a day.    EPWORTH SLEEPINESS SCALE  On a scale of 0 - 3 what is the chance of dozing:  Sitting and Reading:   0 Watching TV:    1 Sitting inactive in a public place: 2 Passenger in car for one hour: 3 Lying down to rest in the afternoon: 1 Sitting and talking to someone: 0 Sitting quietly after lunch:  3 In a car, stopped in traffic:  0  Total  (out of 24): 10/24   mild EDS   MS HISTORY: On 06/28/2017, she felt that there was something in her left eye when she woke up. As the day went on she began to note more difficulty with her vision and this worsened further over the next couple days until she was unable to see. A couple days later she presented to the [facility] MRI was consistent with left optic neuritis and also showed other changes consistent with MS. She received 5 days of IV Solu-Medrol.  Vision improved.  She entered the [id number]-8700 drug study.  The active medication has since been approved by the FDA as Vumerity.  She tolerated it well after the first couple days and has not had any exacerbations.    Her brother has alopecia.  No FH of MS   IMAGING: MRI of the brain performed 07/03/2017 shows an enhancing lesion in the left optic nerve just distal to the chiasm. There are multiple T2/FLAIR hyperintense foci in the periventricular, juxtacortical deep white matter of the hemispheres. There are also foci in the pons, left middle cerebellar peduncle, left cerebellar hemisphere and left thalamus  Two of the lesions enhance,  one in the right juxtacortical white matter and another in the left thalamus.  There is a subtle focus at the cervicomedullary junction  on the sagittal FLAIR images of the brain that is not seen on the T2-weighted images of the cervical spine performed last week.   MRI of the brain 08/30/2019 showed no enhancing lesion but there was one new lesion in the subcortical right parietal lobe that was not present on the previous MRI from 09/22/2018 consistent with a new MS plaque that occurred over the prior year.   REVIEW OF SYSTEMS: Constitutional: No fevers, chills, sweats, or change in appetite Eyes: No visual changes, double vision, eye pain Ear, nose and throat: No hearing loss, ear pain, nasal congestion, sore throat Cardiovascular: No chest pain, palpitations Respiratory:  No shortness of breath at rest or  with exertion.   No wheezes GastrointestinaI: No nausea, vomiting, diarrhea, abdominal pain, fecal incontinence Genitourinary:  No dysuria, urinary retention or frequency.  No nocturia. Musculoskeletal:  No neck pain, back pain Integumentary: No rash, pruritus, skin lesions Neurological: as above Psychiatric: No depression at this time.  No anxiety Endocrine: No palpitations, diaphoresis, change in appetite, change in weigh or increased thirst Hematologic/Lymphatic:  No anemia, purpura, petechiae. Allergic/Immunologic: No itchy/runny eyes, nasal congestion, recent allergic reactions, rashes  ALLERGIES: Allergies  Allergen Reactions   Vicodin [Hydrocodone-Acetaminophen] Shortness Of Breath and Itching    HOME MEDICATIONS:  Current Outpatient Medications:    etodolac (LODINE) 400 MG tablet, Take 1 tablet (400 mg total) by mouth 2 (two) times daily., Disp: 60 tablet, Rfl: 5   fluconazole (DIFLUCAN) 150 MG tablet, Take 1 tablet (150 mg total) by mouth daily., Disp: 2 tablet, Rfl: 0   hydrOXYzine (ATARAX/VISTARIL) 10 MG tablet, Take 1 tablet (10 mg total) by mouth 3 (three) times daily as needed., Disp: 30 tablet, Rfl: 0   Melatonin 2.5 MG CAPS, Take 2.5 mg by mouth at bedtime., Disp: , Rfl:    methylPREDNISolone (MEDROL) 4 MG tablet, Taper from 6 pills po for one day to 1 pill po the last day over 6 days, Disp: 21 tablet, Rfl: 0   ocrelizumab 600 mg in sodium chloride 0.9 % 500 mL, Inject 600 mg into the vein every 6 (six) months., Disp: , Rfl:    amphetamine-dextroamphetamine (ADDERALL XR) 15 MG 24 hr capsule, Take 1 capsule by mouth every morning., Disp: 30 capsule, Rfl: 0   amphetamine-dextroamphetamine (ADDERALL) 10 MG tablet, Take 1 tablet (10 mg total) by mouth 2 (two) times daily., Disp: 60 tablet, Rfl: 0  PAST MEDICAL HISTORY: Past Medical History:  Diagnosis Date   Optic neuritis 07/03/2017    PAST SURGICAL HISTORY: Past Surgical History:  Procedure Laterality Date    ABDOMINAL HERNIA REPAIR     at the age of 37   WISDOM TOOTH EXTRACTION      FAMILY HISTORY: Family History  Problem Relation Age of Onset   Pulmonary fibrosis Father    Alopecia Brother     SOCIAL HISTORY:  Social History   Socioeconomic History   Marital status: Married    Spouse name: Not on file   Number of children: Not on file   Years of education: Not on file   Highest education level: Not on file  Occupational History   Not on file  Tobacco Use   Smoking status: Every Day    Packs/day: 0.75    Years: 16.00    Additional pack years: 0.00    Total pack years: 12.00    Types: Cigarettes   Smokeless tobacco: Never  Vaping Use   Vaping Use: Never used  Substance and Sexual  Activity   Alcohol use: No   Drug use: No   Sexual activity: Never  Other Topics Concern   Not on file  Social History Narrative   Not on file   Social Determinants of Health   Financial Resource Strain: Not on file  Food Insecurity: Not on file  Transportation Needs: Not on file  Physical Activity: Not on file  Stress: Not on file  Social Connections: Not on file  Intimate Partner Violence: Not on file     PHYSICAL EXAM  Vitals:   10/30/22 0826  BP: 124/79  Pulse: 81  Weight: 156 lb (70.8 kg)  Height: 5\' 9"  (1.753 m)    Body mass index is 23.04 kg/m.   General: The patient is well-developed and well-nourished and in no acute distress  Skin: Extremities are without significant edema.  Musculoskeletal:  mildly tender over joints of hands.  Joints are not hot or red.  Back is slightly tender  Neurologic Exam  Mental status: The patient is alert and oriented x 3 at the time of the examination. The patient has apparent normal recent and remote memory, with an apparently normal attention span and concentration ability.   Speech is normal.  Cranial nerves: Extraocular movements are full. She has a 1+ left APD.  Color vision was symmetric.  Facial symmetry is present. There  is good facial sensation to soft touch bilaterally. Facial strength is normal.  Trapezius and sternocleidomastoid strength is normal. No dysarthria is noted.   No obvious hearing deficits are noted.  Motor:  Muscle bulk is normal.   Tone is normal. Strength is  5 / 5 in all 4 extremities.   Sensory: She has Tinel's signs at the elbows and wrists.   Sensory testing is intact to pinprick, soft touch and vibration sensation in all 4 extremities except reduced vibrion oin the left leg.     Coordination: Cerebellar testing reveals good finger-nose-finger and heel-to-shin bilaterally.  Gait and station: Station is normal.   The gait and tandem gait are normal.  Reflexes: Deep tendon reflexes are symmetric and normal bilaterally.      DIAGNOSTIC DATA (LABS, IMAGING, TESTING) - I reviewed patient records, labs, notes, testing and imaging myself where available.  Lab Results  Component Value Date   WBC 7.4 04/30/2022   HGB 14.3 04/30/2022   HCT 41.1 04/30/2022   MCV 97 04/30/2022   PLT 185 04/30/2022      Component Value Date/Time   NA 138 06/09/2020 0859   K 5.4 (H) 06/09/2020 0859   CL 101 06/09/2020 0859   CO2 24 06/09/2020 0859   GLUCOSE 86 06/09/2020 0859   GLUCOSE 115 (H) 07/02/2017 1834   BUN 11 06/09/2020 0859   CREATININE 0.80 06/09/2020 0859   CALCIUM 9.9 06/09/2020 0859   PROT 6.7 12/28/2020 0842   ALBUMIN 4.6 12/28/2020 0842   AST 12 12/28/2020 0842   ALT 7 12/28/2020 0842   ALKPHOS 58 12/28/2020 0842   BILITOT 0.4 12/28/2020 0842   GFRNONAA 95 06/09/2020 0859   GFRAA 110 06/09/2020 0859       ASSESSMENT AND PLAN  Multiple sclerosis (HCC) - Plan: IgG, IgA, IgM, CBC with Differential/Platelet, MR BRAIN W WO CONTRAST, MR CERVICAL SPINE W WO CONTRAST  High risk medication use - Plan: IgG, IgA, IgM, CBC with Differential/Platelet  Vitamin D deficiency - Plan: VITAMIN D 25 Hydroxy (Vit-D Deficiency, Fractures)  Joint swelling - Plan: Uric Acid  Excessive  daytime sleepiness - Plan:  Home sleep test  Snoring - Plan: Home sleep test  Bilateral hand pain - Plan: MR CERVICAL SPINE W WO CONTRAST    1.  Continue Ocrevus, check labs.    check MRI brain and cervical spine to determine if subclinical progression and consider a different DMT if more is seen 2.   She has snoring and EDS.  We will check a HST to assess for OSA    Continue Adderall 10 mg up to 2 times a day or as needed 3.   Etiology of hand pain unclear.   Unlikely to be MS.  Inflammatory labs were fine.    Steroid apck nd etodolac.   If not better consider referral to rheumatology. 4.    Stay active and exercise as tolerated. 5.     rtc 6 months.  43-minute office visit with the majority of the time spent face-to-face for history and physical, discussion/counseling and decision-making.  Additional time with record review and documentation.  Atoya Andrew A. Felecia Shelling, MD, PhD, Charlynn Grimes [id number], [id number] Certified in Neurology, Clinical Neurophysiology, Sleep Medicine, Pain Medicine and Neuroimaging  [facility] [address] [place], [place] [address] [phone]

## 2022-10-31 ENCOUNTER — Other Ambulatory Visit: Payer: Self-pay | Admitting: Neurology

## 2022-10-31 ENCOUNTER — Telehealth: Payer: Self-pay | Admitting: Neurology

## 2022-10-31 LAB — CBC WITH DIFFERENTIAL/PLATELET
Basophils Absolute: 0 10*3/uL (ref 0.0–0.2)
Basos: 0 %
EOS (ABSOLUTE): 0.1 10*3/uL (ref 0.0–0.4)
Eos: 1 %
Hematocrit: 45.1 % (ref 34.0–46.6)
Hemoglobin: 15.3 g/dL (ref 11.1–15.9)
Immature Grans (Abs): 0 10*3/uL (ref 0.0–0.1)
Immature Granulocytes: 0 %
Lymphocytes Absolute: 1.4 10*3/uL (ref 0.7–3.1)
Lymphs: 22 %
MCH: 33.3 pg — ABNORMAL HIGH (ref 26.6–33.0)
MCHC: 33.9 g/dL (ref 31.5–35.7)
MCV: 98 fL — ABNORMAL HIGH (ref 79–97)
Monocytes Absolute: 0.7 10*3/uL (ref 0.1–0.9)
Monocytes: 11 %
Neutrophils Absolute: 4.1 10*3/uL (ref 1.4–7.0)
Neutrophils: 66 %
Platelets: 213 10*3/uL (ref 150–450)
RBC: 4.6 x10E6/uL (ref 3.77–5.28)
RDW: 12.2 % (ref 11.7–15.4)
WBC: 6.3 10*3/uL (ref 3.4–10.8)

## 2022-10-31 LAB — IGG, IGA, IGM
IgA/Immunoglobulin A, Serum: 124 mg/dL (ref 87–352)
IgG (Immunoglobin G), Serum: 748 mg/dL (ref 586–1602)
IgM (Immunoglobulin M), Srm: 150 mg/dL (ref 26–217)

## 2022-10-31 LAB — VITAMIN D 25 HYDROXY (VIT D DEFICIENCY, FRACTURES): Vit D, 25-Hydroxy: 11.9 ng/mL — ABNORMAL LOW (ref 30.0–100.0)

## 2022-10-31 LAB — URIC ACID: Uric Acid: 3.3 mg/dL (ref 2.6–6.2)

## 2022-10-31 MED ORDER — AMPHETAMINE-DEXTROAMPHETAMINE 10 MG PO TABS: 10.0000 mg | ORAL_TABLET | Freq: Two times a day (BID) | ORAL | 0 refills | Status: DC

## 2022-10-31 MED ORDER — AMPHETAMINE-DEXTROAMPHET ER 15 MG PO CP24: 15.0000 mg | ORAL_CAPSULE | ORAL | 0 refills | Status: DC

## 2022-10-31 MED ORDER — VITAMIN D (ERGOCALCIFEROL) 1.25 MG (50000 UNIT) PO CAPS: 50000.0000 [IU] | ORAL_CAPSULE | ORAL | 3 refills | Status: AC

## 2022-10-31 NOTE — Telephone Encounter (Signed)
Tamara Booth: [id number] exp. 10/31/22-11/29/22 sent to GI [phone]

## 2022-11-01 ENCOUNTER — Encounter: Payer: Self-pay | Admitting: *Deleted

## 2022-11-22 ENCOUNTER — Encounter: Payer: Self-pay | Admitting: Neurology

## 2022-11-23 ENCOUNTER — Ambulatory Visit
Admission: RE | Admit: 2022-11-23 | Discharge: 2022-11-23 | Disposition: A | Discharge status: Home / Self Care | Payer: BC Managed Care – PPO | Source: Ambulatory Visit | Attending: Neurology | Admitting: Neurology

## 2022-11-23 DIAGNOSIS — G35 Multiple sclerosis: Secondary | ICD-10-CM

## 2022-11-23 DIAGNOSIS — M79641 Pain in right hand: Secondary | ICD-10-CM

## 2022-11-23 MED ORDER — GADOPICLENOL 0.5 MMOL/ML IV SOLN
8.0000 mL | Freq: Once | INTRAVENOUS | Status: AC | PRN
  Administered 2022-11-23: 8 mL via INTRAVENOUS

## 2022-11-26 ENCOUNTER — Other Ambulatory Visit: Payer: Self-pay | Admitting: Neurology

## 2022-11-26 MED ORDER — AMPHETAMINE-DEXTROAMPHETAMINE 10 MG PO TABS: 10.0000 mg | ORAL_TABLET | Freq: Two times a day (BID) | ORAL | 0 refills | Status: DC

## 2022-11-26 MED ORDER — AMPHETAMINE-DEXTROAMPHET ER 15 MG PO CP24: 15.0000 mg | ORAL_CAPSULE | ORAL | 0 refills | Status: DC

## 2022-11-28 ENCOUNTER — Ambulatory Visit: Payer: BC Managed Care – PPO | Admitting: Neurology

## 2022-11-28 DIAGNOSIS — G4719 Other hypersomnia: Secondary | ICD-10-CM

## 2022-11-28 DIAGNOSIS — G4710 Hypersomnia, unspecified: Secondary | ICD-10-CM

## 2022-11-28 DIAGNOSIS — R0683 Snoring: Secondary | ICD-10-CM

## 2022-12-04 ENCOUNTER — Encounter: Payer: Self-pay | Admitting: Neurology

## 2022-12-04 DIAGNOSIS — G4733 Obstructive sleep apnea (adult) (pediatric): Secondary | ICD-10-CM

## 2022-12-04 DIAGNOSIS — G4719 Other hypersomnia: Secondary | ICD-10-CM

## 2022-12-04 NOTE — Progress Notes (Signed)
   [facility]  HOME SLEEP STUDY  STUDY DATE: 12/02/2022 PATIENT NAME: Tamara Booth DOB: [birth year] MRN: [id number]  ORDERING CLINICIAN: Richard A. Epimenio Foot, MD, PhD   CLINICAL INFORMATION: 37 year old woman with multiple sclerosis, snoring and excessive daytime sleepiness.   IMPRESSION:  Minimal OSA with pAHI 3% of 4.6.  The OSA was mild during REM sleep with REM pAHI 3% of 9.8. Normal sleep efficiency of 87%.  Normal percent REM sleep of 30% No nocturnal hypoxemia.   RECOMMENDATION: The OSA was minimal overall and CPAP is not indicated.  She might benefit from the use of an oral appliance. Follow-up with Dr. Epimenio Foot.   INTERPRETING PHYSICIAN:   Richard A. Epimenio Foot, MD, PhD, [facility] Certified in Neurology, Clinical Neurophysiology, Sleep Medicine, Pain Medicine and Neuroimaging  [facility] [address] [place], [place] [address] [phone]

## 2022-12-05 ENCOUNTER — Telehealth: Payer: Self-pay | Admitting: Neurology

## 2022-12-05 NOTE — Telephone Encounter (Signed)
Referral sent to [facility], phone # [phone]. 

## 2022-12-12 NOTE — Telephone Encounter (Signed)
Faxed signed order/notes below back to Dr. Myrtis Ser at [phone]. Received fax confirmation.

## 2022-12-31 ENCOUNTER — Other Ambulatory Visit: Payer: Self-pay | Admitting: Neurology

## 2022-12-31 MED ORDER — AMPHETAMINE-DEXTROAMPHET ER 15 MG PO CP24: 15.0000 mg | ORAL_CAPSULE | ORAL | 0 refills | Status: DC

## 2022-12-31 MED ORDER — AMPHETAMINE-DEXTROAMPHETAMINE 10 MG PO TABS: 10.0000 mg | ORAL_TABLET | Freq: Two times a day (BID) | ORAL | 0 refills | Status: DC

## 2022-12-31 NOTE — Telephone Encounter (Signed)
Pt last seen on 10/30/22 per note " Continue Adderall 10 mg up to 2 times a day or as needed "  Follow up scheduled on 05/20/23  Dr.Sater this refill is for Adderall Xr 15 mg last filled on 11/26/22 #60 tablets (30 day supply)  Adderall 10 mg tablets last filled on 11/28/22 # 60 tablets (30 day supply)   Rx pending to be signed

## 2023-02-04 ENCOUNTER — Other Ambulatory Visit: Payer: Self-pay | Admitting: Neurology

## 2023-02-05 MED ORDER — AMPHETAMINE-DEXTROAMPHET ER 15 MG PO CP24: 15.0000 mg | ORAL_CAPSULE | ORAL | 0 refills | Status: DC

## 2023-02-05 MED ORDER — AMPHETAMINE-DEXTROAMPHETAMINE 10 MG PO TABS: 10.0000 mg | ORAL_TABLET | Freq: Two times a day (BID) | ORAL | 0 refills | Status: DC

## 2023-03-07 ENCOUNTER — Other Ambulatory Visit: Payer: Self-pay | Admitting: Neurology

## 2023-03-07 MED ORDER — AMPHETAMINE-DEXTROAMPHET ER 15 MG PO CP24: 15.0000 mg | ORAL_CAPSULE | ORAL | 0 refills | Status: DC

## 2023-03-07 MED ORDER — AMPHETAMINE-DEXTROAMPHETAMINE 10 MG PO TABS: 10.0000 mg | ORAL_TABLET | Freq: Two times a day (BID) | ORAL | 0 refills | Status: DC

## 2023-03-07 NOTE — Telephone Encounter (Signed)
Last seen on 10/30/22 per note "MS related attention deficit is helped by Adderall 10 mg po 1-2 times a day." Follow up scheduled on 05/20/23 Adderall 10 mg last filled on 02/05/23 #60 tablets (30 day supply)   Dr.Sater is patient doing Adderall 10 mg and 15 mg tablet? You only said in note 10 mg dose?

## 2023-04-08 ENCOUNTER — Other Ambulatory Visit: Payer: Self-pay | Admitting: Neurology

## 2023-04-09 MED ORDER — AMPHETAMINE-DEXTROAMPHET ER 15 MG PO CP24: 15.0000 mg | ORAL_CAPSULE | ORAL | 0 refills | Status: DC

## 2023-04-09 MED ORDER — AMPHETAMINE-DEXTROAMPHETAMINE 10 MG PO TABS: 10.0000 mg | ORAL_TABLET | Freq: Two times a day (BID) | ORAL | 0 refills | Status: DC

## 2023-05-09 ENCOUNTER — Other Ambulatory Visit: Payer: Self-pay | Admitting: Neurology

## 2023-05-09 MED ORDER — AMPHETAMINE-DEXTROAMPHET ER 15 MG PO CP24: 15.0000 mg | ORAL_CAPSULE | ORAL | 0 refills | Status: DC

## 2023-05-09 MED ORDER — AMPHETAMINE-DEXTROAMPHETAMINE 10 MG PO TABS: 10.0000 mg | ORAL_TABLET | Freq: Two times a day (BID) | ORAL | 0 refills | Status: DC

## 2023-05-18 NOTE — Progress Notes (Deleted)
[facility]  PATIENT: Tamara Booth DOB: 03-17-1986  REFERRING DOCTOR OR PCP:  Juluis Rainier SOURCE: Patient, hospital records, imaging and lab reports, MRI images on PACS  _________________________________   HISTORICAL  CHIEF COMPLAINT:  No chief complaint on file.   HISTORY OF PRESENT ILLNESS:   Tamara Booth is a 37 y.o. woman diagnosed with MS following an episode of left optic neuritis in 2018.  Update 04/30/2022 She woke up one day in February with bilateral hand pain, in the palm an knuckles.     ANA, ESR, RF and ANA were normal/non-contributary.   There was no trauma.  There I no heat over the joints.    She is on Ocrevus, last infusion 09/2022 and is tolerating it well.    She was able to lose the weight that she had gained the past year..  She is walking more for exercise.   She feels neurologically stable with no exacerbations.  Specifically, she has not noted any problems with gait, balance, strength, sensation, or bladder function. She no longer gets hand tingling.    She uses the bannister on stairs for safety.    Vision is fine.  She notes some fatigue but feels that she does about the same as other people she knows.   She has some sleep maintenance insomnia.   She tried trazodone but had complex sleep walking behavior without recall so stopped.    Mood and cognition are doing well.  Her MS related attention deficit is helped by Adderall 10 mg po 1-2 times a day.    EPWORTH SLEEPINESS SCALE  On a scale of 0 - 3 what is the chance of dozing:  Sitting and Reading:   0 Watching TV:    1 Sitting inactive in a public place: 2 Passenger in car for one hour: 3 Lying down to rest in the afternoon: 1 Sitting and talking to someone: 0 Sitting quietly after lunch:  3 In a car, stopped in traffic:  0  Total (out of 24): 10/24   mild EDS   MS HISTORY: On 06/28/2017, she felt that there was something in her left eye when she woke up. As the day went  on she began to note more difficulty with her vision and this worsened further over the next couple days until she was unable to see. A couple days later she presented to the [facility] MRI was consistent with left optic neuritis and also showed other changes consistent with MS. She received 5 days of IV Solu-Medrol.  Vision improved.  She entered the Alkermes-8700 drug study.  The active medication has since been approved by the FDA as Vumerity.  She tolerated it well after the first couple days and has not had any exacerbations.    Her brother has alopecia.  No FH of MS   IMAGING: MRI of the brain 11/23/2022 shows T2/FLAIR hyperintense foci in the hemispheres, brainstem, thalamus and left middle cerebellar peduncle consistent with chronic demyelination associated with multiple sclerosis.  Compared to the MRI from 12/22/2020, there were no new lesions.  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  MRI of the cervical spine 11/23/2022 showed a focus at the cervicomedullary junction and foci within the brainstem and left middle cerebellar peduncle consistent with MS.  No new lesions compared to previous MRIs.  Including MRI of the cervical spine from 2018.  MRI of the brain performed 07/03/2017 shows an enhancing lesion in the left optic nerve just distal to  the chiasm. There are multiple T2/FLAIR hyperintense foci in the periventricular, juxtacortical deep white matter of the hemispheres. There are also foci in the pons, left middle cerebellar peduncle, left cerebellar hemisphere and left thalamus  Two of the lesions enhance,  one in the right juxtacortical white matter and another in the left thalamus.  There is a subtle focus at the cervicomedullary junction on the sagittal FLAIR images of the brain that is not seen on the T2-weighted images of the cervical spine performed last week.   MRI of the brain 08/30/2019 showed no enhancing lesion but there was one new lesion in the subcortical right parietal lobe that was not  present on the previous MRI from 09/22/2018 consistent with a new MS plaque that occurred over the prior year.   REVIEW OF SYSTEMS: Constitutional: No fevers, chills, sweats, or change in appetite Eyes: No visual changes, double vision, eye pain Ear, nose and throat: No hearing loss, ear pain, nasal congestion, sore throat Cardiovascular: No chest pain, palpitations Respiratory:  No shortness of breath at rest or with exertion.   No wheezes GastrointestinaI: No nausea, vomiting, diarrhea, abdominal pain, fecal incontinence Genitourinary:  No dysuria, urinary retention or frequency.  No nocturia. Musculoskeletal:  No neck pain, back pain Integumentary: No rash, pruritus, skin lesions Neurological: as above Psychiatric: No depression at this time.  No anxiety Endocrine: No palpitations, diaphoresis, change in appetite, change in weigh or increased thirst Hematologic/Lymphatic:  No anemia, purpura, petechiae. Allergic/Immunologic: No itchy/runny eyes, nasal congestion, recent allergic reactions, rashes  ALLERGIES: Allergies  Allergen Reactions   Vicodin [Hydrocodone-Acetaminophen] Shortness Of Breath and Itching    HOME MEDICATIONS:  Current Outpatient Medications:    Vitamin D, Ergocalciferol, (DRISDOL) 1.25 MG (50000 UNIT) CAPS capsule, Take 1 capsule (50,000 Units total) by mouth every 7 (seven) days., Disp: 13 capsule, Rfl: 3   amphetamine-dextroamphetamine (ADDERALL XR) 15 MG 24 hr capsule, Take 1 capsule by mouth every morning., Disp: 30 capsule, Rfl: 0   amphetamine-dextroamphetamine (ADDERALL) 10 MG tablet, Take 1 tablet (10 mg total) by mouth 2 (two) times daily., Disp: 60 tablet, Rfl: 0   etodolac (LODINE) 400 MG tablet, Take 1 tablet (400 mg total) by mouth 2 (two) times daily., Disp: 60 tablet, Rfl: 5   fluconazole (DIFLUCAN) 150 MG tablet, Take 1 tablet (150 mg total) by mouth daily., Disp: 2 tablet, Rfl: 0   hydrOXYzine (ATARAX/VISTARIL) 10 MG tablet, Take 1 tablet (10 mg  total) by mouth 3 (three) times daily as needed., Disp: 30 tablet, Rfl: 0   Melatonin 2.5 MG CAPS, Take 2.5 mg by mouth at bedtime., Disp: , Rfl:    methylPREDNISolone (MEDROL) 4 MG tablet, Taper from 6 pills po for one day to 1 pill po the last day over 6 days, Disp: 21 tablet, Rfl: 0   ocrelizumab 600 mg in sodium chloride 0.9 % 500 mL, Inject 600 mg into the vein every 6 (six) months., Disp: , Rfl:   PAST MEDICAL HISTORY: Past Medical History:  Diagnosis Date   Optic neuritis 07/03/2017    PAST SURGICAL HISTORY: Past Surgical History:  Procedure Laterality Date   ABDOMINAL HERNIA REPAIR     at the age of 80   WISDOM TOOTH EXTRACTION      FAMILY HISTORY: Family History  Problem Relation Age of Onset   Pulmonary fibrosis Father    Alopecia Brother     SOCIAL HISTORY:  Social History   Socioeconomic History   Marital status: Married  Spouse name: Not on file   Number of children: Not on file   Years of education: Not on file   Highest education level: Not on file  Occupational History   Not on file  Tobacco Use   Smoking status: Every Day    Current packs/day: 0.75    Average packs/day: 0.8 packs/day for 16.0 years (12.0 ttl pk-yrs)    Types: Cigarettes   Smokeless tobacco: Never  Vaping Use   Vaping status: Never Used  Substance and Sexual Activity   Alcohol use: No   Drug use: No   Sexual activity: Never  Other Topics Concern   Not on file  Social History Narrative   Not on file   Social Determinants of Health   Financial Resource Strain: Not on file  Food Insecurity: Not on file  Transportation Needs: Not on file  Physical Activity: Not on file  Stress: Not on file  Social Connections: Unknown (10/02/2022)   Received from [facility], [facility]   Social Network    Social Network: Not on file  Intimate Partner Violence: Unknown (10/02/2022)   Received from [facility], [facility]   HITS    Physically Hurt: Not on file    Insult or  Talk Down To: Not on file    Threaten Physical Harm: Not on file    Scream or Curse: Not on file     PHYSICAL EXAM  There were no vitals filed for this visit.   There is no height or weight on file to calculate BMI.   General: The patient is well-developed and well-nourished and in no acute distress  Skin: Extremities are without significant edema.  Musculoskeletal:  mildly tender over joints of hands.  Joints are not hot or red.  Back is slightly tender  Neurologic Exam  Mental status: The patient is alert and oriented x 3 at the time of the examination. The patient has apparent normal recent and remote memory, with an apparently normal attention span and concentration ability.   Speech is normal.  Cranial nerves: Extraocular movements are full. She has a 1+ left APD.  Color vision was symmetric.  Facial symmetry is present. There is good facial sensation to soft touch bilaterally. Facial strength is normal.  Trapezius and sternocleidomastoid strength is normal. No dysarthria is noted.   No obvious hearing deficits are noted.  Motor:  Muscle bulk is normal.   Tone is normal. Strength is  5 / 5 in all 4 extremities.   Sensory: She has Tinel's signs at the elbows and wrists.   Sensory testing is intact to pinprick, soft touch and vibration sensation in all 4 extremities except reduced vibrion oin the left leg.     Coordination: Cerebellar testing reveals good finger-nose-finger and heel-to-shin bilaterally.  Gait and station: Station is normal.   The gait and tandem gait are normal.  Reflexes: Deep tendon reflexes are symmetric and normal bilaterally.      DIAGNOSTIC DATA (LABS, IMAGING, TESTING) - I reviewed patient records, labs, notes, testing and imaging myself where available.  Lab Results  Component Value Date   WBC 6.3 10/30/2022   HGB 15.3 10/30/2022   HCT 45.1 10/30/2022   MCV 98 (H) 10/30/2022   PLT 213 10/30/2022      Component Value Date/Time   NA 138  06/09/2020 0859   K 5.4 (H) 06/09/2020 0859   CL 101 06/09/2020 0859   CO2 24 06/09/2020 0859   GLUCOSE 86 06/09/2020 0859  GLUCOSE 115 (H) 07/02/2017 1834   BUN 11 06/09/2020 0859   CREATININE 0.80 06/09/2020 0859   CALCIUM 9.9 06/09/2020 0859   PROT 6.7 12/28/2020 0842   ALBUMIN 4.6 12/28/2020 0842   AST 12 12/28/2020 0842   ALT 7 12/28/2020 0842   ALKPHOS 58 12/28/2020 0842   BILITOT 0.4 12/28/2020 0842   GFRNONAA 95 06/09/2020 0859   GFRAA 110 06/09/2020 0859       ASSESSMENT AND PLAN  No diagnosis found.    1.  Continue Ocrevus, check labs.    check MRI brain and cervical spine to determine if subclinical progression and consider a different DMT if more is seen 2.   She has snoring and EDS.  We will check a HST to assess for OSA    Continue Adderall 10 mg up to 2 times a day or as needed 3.   Etiology of hand pain unclear.   Unlikely to be MS.  Inflammatory labs were fine.    Steroid apck nd etodolac.   If not better consider referral to rheumatology. 4.    Stay active and exercise as tolerated. 5.     rtc 6 months.  43-minute office visit with the majority of the time spent face-to-face for history and physical, discussion/counseling and decision-making.  Additional time with record review and documentation.  Marielouise Amey A. Epimenio Foot, MD, PhD, [place] 05/18/2023, 3:41 PM Certified in Neurology, Clinical Neurophysiology, Sleep Medicine, Pain Medicine and Neuroimaging  [facility] [address] [place], [place] [address] [phone]

## 2023-05-20 ENCOUNTER — Ambulatory Visit: Payer: BC Managed Care – PPO | Admitting: Neurology

## 2023-06-10 ENCOUNTER — Other Ambulatory Visit: Payer: Self-pay | Admitting: Neurology

## 2023-06-10 MED ORDER — AMPHETAMINE-DEXTROAMPHETAMINE 10 MG PO TABS: 10.0000 mg | ORAL_TABLET | Freq: Two times a day (BID) | ORAL | 0 refills | Status: DC

## 2023-06-10 NOTE — Telephone Encounter (Signed)
Last seen on 10/30/22 Follow up scheduled on 07/30/23 Last filled on 05/09/23 #60 tablets (30 day supply) Rx pending to be signed

## 2023-07-15 ENCOUNTER — Other Ambulatory Visit: Payer: Self-pay | Admitting: Neurology

## 2023-07-16 MED ORDER — AMPHETAMINE-DEXTROAMPHETAMINE 10 MG PO TABS: 10.0000 mg | ORAL_TABLET | Freq: Two times a day (BID) | ORAL | 0 refills | Status: DC

## 2023-07-16 MED ORDER — AMPHETAMINE-DEXTROAMPHET ER 15 MG PO CP24: 15.0000 mg | ORAL_CAPSULE | ORAL | 0 refills | Status: DC

## 2023-07-16 NOTE — Telephone Encounter (Signed)
Last seen on 10/30/22 Follow up scheduled on 07/30/23 10 mg last filled on 06/10/23 #60 tablets (30 day supply) 15 mg tablets last filled on 05/10/23 #30 tablets (30 day supply)  Rx's pending to be signed

## 2023-07-30 ENCOUNTER — Encounter: Payer: Self-pay | Admitting: Neurology

## 2023-07-30 ENCOUNTER — Ambulatory Visit: Payer: BC Managed Care – PPO | Admitting: Neurology

## 2023-07-30 VITALS — BP 130/91 | HR 78 | Ht 69.0 in | Wt 156.5 lb

## 2023-07-30 DIAGNOSIS — Z79899 Other long term (current) drug therapy: Secondary | ICD-10-CM | POA: Diagnosis not present

## 2023-07-30 DIAGNOSIS — G35 Multiple sclerosis: Secondary | ICD-10-CM | POA: Diagnosis not present

## 2023-07-30 DIAGNOSIS — M2540 Effusion, unspecified joint: Secondary | ICD-10-CM | POA: Diagnosis not present

## 2023-07-30 DIAGNOSIS — R5382 Chronic fatigue, unspecified: Secondary | ICD-10-CM

## 2023-07-30 DIAGNOSIS — G4733 Obstructive sleep apnea (adult) (pediatric): Secondary | ICD-10-CM

## 2023-07-30 MED ORDER — AMPHETAMINE-DEXTROAMPHETAMINE 15 MG PO TABS: 15.0000 mg | ORAL_TABLET | Freq: Two times a day (BID) | ORAL | 0 refills | Status: DC

## 2023-07-30 NOTE — Progress Notes (Signed)
[facility]  PATIENT: Tamara Booth DOB: 01/30/1986  REFERRING DOCTOR OR PCP:  Juluis Rainier SOURCE: Patient, hospital records, imaging and lab reports, MRI images on PACS  _________________________________   HISTORICAL  CHIEF COMPLAINT:  Chief Complaint  Patient presents with   Room 11    Pt is here Alone. Pt states that things have been going good with her MS since her last appointment. Pt denies any new symptoms or concerns to discuss today.     HISTORY OF PRESENT ILLNESS:   Kealie Rosene is a 37 y.o. woman diagnosed with MS following an episode of left optic neuritis in 2018.  Update 07/30/23 She notes less bilateral hand pain.     ANA, ESR, RF and ANA were normal/non-contributary.   The steroid pack only helped a bit but etodolac helped more.     She is on Ocrevus, last infusion 09/2022 and is tolerating it well.    She was able to lose the weight that she had gained the past year..  She is walking more for exercise.   She feels neurologically stable with no exacerbations.  Specifically, she has not noted any problems with gait, balance, strength, sensation, or bladder function. She no longer gets hand tingling.    She uses the bannister on stairs for safety.     Bladder function is doing well.     Vision is fine.  She notes some fatigue but feels that she does about the same as other people she knows.   She has some sleep maintenance insomnia.   She tried trazodone but had complex sleep walking behavior without recall so stopped.    Mood and cognition are doing well.  Her MS related attention deficit is helped by Adderall 10 mg po 1-2 times a day.    EPWORTH SLEEPINESS SCALE  On a scale of 0 - 3 what is the chance of dozing:  Sitting and Reading:   0 Watching TV:    1 Sitting inactive in a public place: 2 Passenger in car for one hour: 3 Lying down to rest in the afternoon: 1 Sitting and talking to someone: 0 Sitting quietly after  lunch:  3 In a car, stopped in traffic:  0  Total (out of 24): 10/24   mild EDS   MS HISTORY: On 06/28/2017, she felt that there was something in her left eye when she woke up. As the day went on she began to note more difficulty with her vision and this worsened further over the next couple days until she was unable to see. A couple days later she presented to the [facility] MRI was consistent with left optic neuritis and also showed other changes consistent with MS. She received 5 days of IV Solu-Medrol.  Vision improved.  She entered the Alkermes-8700 drug study.  The active medication has since been approved by the FDA as Vumerity.  She tolerated it well after the first couple days and has not had any exacerbations.  She switched to Ocrevus in 2022.  Her brother has alopecia.  No FH of MS   IMAGING: MRI of the brain performed 07/03/2017 shows an enhancing lesion in the left optic nerve just distal to the chiasm. There are multiple T2/FLAIR hyperintense foci in the periventricular, juxtacortical deep white matter of the hemispheres. There are also foci in the pons, left middle cerebellar peduncle, left cerebellar hemisphere and left thalamus  Two of the lesions enhance,  one in the right juxtacortical white  matter and another in the left thalamus.  There is a subtle focus at the cervicomedullary junction on the sagittal FLAIR images of the brain that is not seen on the T2-weighted images of the cervical spine performed last week.   MRI of the brain 08/30/2019 showed no enhancing lesion but there was one new lesion in the subcortical right parietal lobe that was not present on the previous MRI from 09/22/2018 consistent with a new MS plaque that occurred over the prior year.     REVIEW OF SYSTEMS: Constitutional: No fevers, chills, sweats, or change in appetite Eyes: No visual changes, double vision, eye pain Ear, nose and throat: No hearing loss, ear pain, nasal congestion, sore  throat Cardiovascular: No chest pain, palpitations Respiratory:  No shortness of breath at rest or with exertion.   No wheezes GastrointestinaI: No nausea, vomiting, diarrhea, abdominal pain, fecal incontinence Genitourinary:  No dysuria, urinary retention or frequency.  No nocturia. Musculoskeletal:  No neck pain, back pain Integumentary: No rash, pruritus, skin lesions Neurological: as above Psychiatric: No depression at this time.  No anxiety Endocrine: No palpitations, diaphoresis, change in appetite, change in weigh or increased thirst Hematologic/Lymphatic:  No anemia, purpura, petechiae. Allergic/Immunologic: No itchy/runny eyes, nasal congestion, recent allergic reactions, rashes  ALLERGIES: Allergies  Allergen Reactions   Vicodin [Hydrocodone-Acetaminophen] Shortness Of Breath and Itching    HOME MEDICATIONS:  Current Outpatient Medications:    amphetamine-dextroamphetamine (ADDERALL XR) 15 MG 24 hr capsule, Take 1 capsule by mouth every morning., Disp: 30 capsule, Rfl: 0   ocrelizumab 600 mg in sodium chloride 0.9 % 500 mL, Inject 600 mg into the vein every 6 (six) months., Disp: , Rfl:    Vitamin D, Ergocalciferol, (DRISDOL) 1.25 MG (50000 UNIT) CAPS capsule, Take 1 capsule (50,000 Units total) by mouth every 7 (seven) days., Disp: 13 capsule, Rfl: 3   amphetamine-dextroamphetamine (ADDERALL) 15 MG tablet, Take 1 tablet by mouth 2 (two) times daily., Disp: 60 tablet, Rfl: 0   etodolac (LODINE) 400 MG tablet, Take 1 tablet (400 mg total) by mouth 2 (two) times daily. (Patient not taking: Reported on 07/30/2023), Disp: 60 tablet, Rfl: 5   fluconazole (DIFLUCAN) 150 MG tablet, Take 1 tablet (150 mg total) by mouth daily. (Patient not taking: Reported on 07/30/2023), Disp: 2 tablet, Rfl: 0   hydrOXYzine (ATARAX/VISTARIL) 10 MG tablet, Take 1 tablet (10 mg total) by mouth 3 (three) times daily as needed. (Patient not taking: Reported on 07/30/2023), Disp: 30 tablet, Rfl: 0    Melatonin 2.5 MG CAPS, Take 2.5 mg by mouth at bedtime. (Patient not taking: Reported on 07/30/2023), Disp: , Rfl:    methylPREDNISolone (MEDROL) 4 MG tablet, Taper from 6 pills po for one day to 1 pill po the last day over 6 days (Patient not taking: Reported on 07/30/2023), Disp: 21 tablet, Rfl: 0  PAST MEDICAL HISTORY: Past Medical History:  Diagnosis Date   Optic neuritis 07/03/2017    PAST SURGICAL HISTORY: Past Surgical History:  Procedure Laterality Date   ABDOMINAL HERNIA REPAIR     at the age of 13   WISDOM TOOTH EXTRACTION      FAMILY HISTORY: Family History  Problem Relation Age of Onset   Pulmonary fibrosis Father    Alopecia Brother    Multiple sclerosis Neg Hx     SOCIAL HISTORY:  Social History   Socioeconomic History   Marital status: Married    Spouse name: Not on file   Number of children: Not  on file   Years of education: Not on file   Highest education level: Not on file  Occupational History   Not on file  Tobacco Use   Smoking status: Every Day    Current packs/day: 0.75    Average packs/day: 0.8 packs/day for 16.0 years (12.0 ttl pk-yrs)    Types: Cigarettes   Smokeless tobacco: Never  Vaping Use   Vaping status: Never Used  Substance and Sexual Activity   Alcohol use: No   Drug use: No   Sexual activity: Never  Other Topics Concern   Not on file  Social History Narrative   Not on file   Social Drivers of Health   Financial Resource Strain: Not on file  Food Insecurity: Not on file  Transportation Needs: Not on file  Physical Activity: Not on file  Stress: Not on file  Social Connections: Unknown (10/02/2022)   Received from [facility], [facility]   Social Network    Social Network: Not on file  Intimate Partner Violence: Unknown (10/02/2022)   Received from [facility], [facility]   HITS    Physically Hurt: Not on file    Insult or Talk Down To: Not on file    Threaten Physical Harm: Not on file    Scream or  Curse: Not on file     PHYSICAL EXAM  Vitals:   07/30/23 1548  BP: (!) 130/91  Pulse: 78  Weight: 156 lb 8 oz (71 kg)  Height: 5\' 9"  (1.753 m)    Body mass index is 23.11 kg/m.   General: The patient is well-developed and well-nourished and in no acute distress  Skin: Extremities are without significant edema.  Musculoskeletal: Currently, she does not have tenderness over the joints of the hand..  Joints are not hot or red.   Neurologic Exam  Mental status: The patient is alert and oriented x 3 at the time of the examination. The patient has apparent normal recent and remote memory, with an apparently normal attention span and concentration ability.   Speech is normal.  Cranial nerves: Extraocular movements are full. She has a 1+ left APD.  Color vision was symmetric.  Facial symmetry is present. There is good facial sensation to soft touch bilaterally. Facial strength is normal.  Trapezius and sternocleidomastoid strength is normal. No dysarthria is noted.   No obvious hearing deficits are noted.  Motor:  Muscle bulk is normal.   Tone is normal. Strength is  5 / 5 in all 4 extremities.   Sensory: She has Tinel's signs at the elbows and wrists.   Sensory testing is intact to pinprick, soft touch and vibration sensation in all 4 extremities except reduced vibrion oin the left leg.     Coordination: Cerebellar testing reveals good finger-nose-finger and heel-to-shin bilaterally.  Gait and station: Station is normal.   The gait and tandem gait are normal.  Reflexes: Deep tendon reflexes are symmetric and normal bilaterally.      DIAGNOSTIC DATA (LABS, IMAGING, TESTING) - I reviewed patient records, labs, notes, testing and imaging myself where available.  Lab Results  Component Value Date   WBC 6.3 10/30/2022   HGB 15.3 10/30/2022   HCT 45.1 10/30/2022   MCV 98 (H) 10/30/2022   PLT 213 10/30/2022      Component Value Date/Time   NA 138 06/09/2020 0859   K 5.4 (H)  06/09/2020 0859   CL 101 06/09/2020 0859   CO2 24 06/09/2020 0859  GLUCOSE 86 06/09/2020 0859   GLUCOSE 115 (H) 07/02/2017 1834   BUN 11 06/09/2020 0859   CREATININE 0.80 06/09/2020 0859   CALCIUM 9.9 06/09/2020 0859   PROT 6.7 12/28/2020 0842   ALBUMIN 4.6 12/28/2020 0842   AST 12 12/28/2020 0842   ALT 7 12/28/2020 0842   ALKPHOS 58 12/28/2020 0842   BILITOT 0.4 12/28/2020 0842   GFRNONAA 95 06/09/2020 0859   GFRAA 110 06/09/2020 0859       ASSESSMENT AND PLAN  Multiple sclerosis (HCC) - Plan: IgG, IgA, IgM, CBC with Differential/Platelet  High risk medication use - Plan: IgG, IgA, IgM, CBC with Differential/Platelet  Joint swelling  Chronic fatigue  OSA (obstructive sleep apnea)    1.  Continue Ocrevus, check labs.     2.   She has snoring and EDS.  The HST showed AHI=4.7/h but during REM was 9.8/h   She may get an oral appliance.   She feels her somnolence and fatigue is a little worse.  We will increase afternoon Adderall to 15 mg up to 2 times a day or as needed.  Continue the XR in the morning 3.   Hand pain is better and can take etodolac prn 4.   Stay active and exercise as tolerated. 5.   rtc 6 months.    Deysy Schabel A. Epimenio Foot, MD, PhD, [place] 07/30/2023, 4:23 PM Certified in Neurology, Clinical Neurophysiology, Sleep Medicine, Pain Medicine and Neuroimaging  [facility] [address] [place], [place] [address] [phone]

## 2023-07-31 ENCOUNTER — Encounter: Payer: Self-pay | Admitting: Neurology

## 2023-07-31 LAB — CBC WITH DIFFERENTIAL/PLATELET
Basophils Absolute: 0 10*3/uL (ref 0.0–0.2)
Basos: 0 %
EOS (ABSOLUTE): 0.1 10*3/uL (ref 0.0–0.4)
Eos: 1 %
Hematocrit: 47.6 % — ABNORMAL HIGH (ref 34.0–46.6)
Hemoglobin: 16.4 g/dL — ABNORMAL HIGH (ref 11.1–15.9)
Immature Grans (Abs): 0 10*3/uL (ref 0.0–0.1)
Immature Granulocytes: 0 %
Lymphocytes Absolute: 2 10*3/uL (ref 0.7–3.1)
Lymphs: 22 %
MCH: 33.5 pg — ABNORMAL HIGH (ref 26.6–33.0)
MCHC: 34.5 g/dL (ref 31.5–35.7)
MCV: 97 fL (ref 79–97)
Monocytes Absolute: 0.9 10*3/uL (ref 0.1–0.9)
Monocytes: 9 %
Neutrophils Absolute: 6.3 10*3/uL (ref 1.4–7.0)
Neutrophils: 68 %
Platelets: 208 10*3/uL (ref 150–450)
RBC: 4.89 x10E6/uL (ref 3.77–5.28)
RDW: 11.7 % (ref 11.7–15.4)
WBC: 9.4 10*3/uL (ref 3.4–10.8)

## 2023-07-31 LAB — IGG, IGA, IGM
IgA/Immunoglobulin A, Serum: 118 mg/dL (ref 87–352)
IgG (Immunoglobin G), Serum: 802 mg/dL (ref 586–1602)
IgM (Immunoglobulin M), Srm: 158 mg/dL (ref 26–217)

## 2023-08-19 ENCOUNTER — Other Ambulatory Visit: Payer: Self-pay | Admitting: Neurology

## 2023-08-19 MED ORDER — AMPHETAMINE-DEXTROAMPHETAMINE 15 MG PO TABS: 15.0000 mg | ORAL_TABLET | Freq: Two times a day (BID) | ORAL | 0 refills | Status: DC

## 2023-08-19 NOTE — Telephone Encounter (Signed)
 Last seen on 07/30/23 Follow up scheduled on 02/27/24 Last filled on 07/17/23 Adderall XL 15 mg tablets #30 (30 day supply) Per pharmacy pt last picked up Adderall 10 mg on 07/17/23 #60 tablets  Per note on 07/29/26  We will increase afternoon Adderall to 15 mg up to 2 times a day or as needed.  Continue the XR in the morning   Rx pending to be signed

## 2023-09-19 ENCOUNTER — Other Ambulatory Visit: Payer: Self-pay | Admitting: Neurology

## 2023-09-19 MED ORDER — AMPHETAMINE-DEXTROAMPHETAMINE 15 MG PO TABS: 15.0000 mg | ORAL_TABLET | Freq: Two times a day (BID) | ORAL | 0 refills | Status: DC

## 2023-09-19 MED ORDER — AMPHETAMINE-DEXTROAMPHET ER 15 MG PO CP24: 15.0000 mg | ORAL_CAPSULE | ORAL | 0 refills | Status: DC

## 2023-09-19 NOTE — Telephone Encounter (Signed)
 Last seen 07/30/23 and next f/u 02/27/24. Last refilled adderall 15mg  08/19/23 #60 and adderall XR 15mg  07/16/23 #30.

## 2023-10-28 ENCOUNTER — Other Ambulatory Visit: Payer: Self-pay | Admitting: Neurology

## 2023-10-28 MED ORDER — AMPHETAMINE-DEXTROAMPHETAMINE 15 MG PO TABS: 15.0000 mg | ORAL_TABLET | Freq: Two times a day (BID) | ORAL | 0 refills | Status: DC

## 2023-10-28 MED ORDER — AMPHETAMINE-DEXTROAMPHET ER 15 MG PO CP24: 15.0000 mg | ORAL_CAPSULE | ORAL | 0 refills | Status: DC

## 2023-10-28 NOTE — Telephone Encounter (Signed)
 Last seen 07/30/23 and next f/u 02/27/24. Last refilled adderall 15mg  IR 09/19/23 #60 and adderall XR 15mg  09/19/23 #30.

## 2023-10-30 ENCOUNTER — Other Ambulatory Visit: Payer: Self-pay | Admitting: Family Medicine

## 2023-10-31 ENCOUNTER — Encounter: Payer: Self-pay | Admitting: Neurology

## 2023-10-31 ENCOUNTER — Other Ambulatory Visit: Payer: Self-pay | Admitting: *Deleted

## 2023-10-31 MED ORDER — FLUCONAZOLE 150 MG PO TABS
150.0000 mg | ORAL_TABLET | Freq: Every day | ORAL | 0 refills | Status: DC
Start: 2023-10-31 — End: 2024-02-27

## 2023-12-11 ENCOUNTER — Other Ambulatory Visit: Payer: Self-pay | Admitting: Neurology

## 2023-12-11 MED ORDER — AMPHETAMINE-DEXTROAMPHETAMINE 15 MG PO TABS: 15.0000 mg | ORAL_TABLET | Freq: Two times a day (BID) | ORAL | 0 refills | Status: DC

## 2023-12-11 NOTE — Telephone Encounter (Signed)
 Last seen on 07/30/23 Follow up scheduled on 02/27/24   Dispensed Days Supply Quantity Provider Pharmacy  D-AMPHETAMINE  ER 15MG  SALT COMBO CP 10/28/2023 30 30 each Sater, Sherida Dimmer, MD [facility]      Rx pending to be signed

## 2024-01-01 ENCOUNTER — Telehealth: Payer: Self-pay | Admitting: *Deleted

## 2024-01-01 NOTE — Telephone Encounter (Signed)
 Faxed back completed/signed form below to [employer]. Received fax confirmation.

## 2024-01-20 ENCOUNTER — Encounter: Payer: Self-pay | Admitting: Neurology

## 2024-01-20 MED ORDER — AMPHETAMINE-DEXTROAMPHET ER 15 MG PO CP24: 15.0000 mg | ORAL_CAPSULE | ORAL | 0 refills | Status: DC

## 2024-01-20 MED ORDER — AMPHETAMINE-DEXTROAMPHETAMINE 15 MG PO TABS: 15.0000 mg | ORAL_TABLET | Freq: Two times a day (BID) | ORAL | 0 refills | Status: DC

## 2024-01-20 NOTE — Telephone Encounter (Signed)
 Last seen on 07/30/23 Follow up scheduled on 02/27/24   Dispensed Days Supply Quantity Provider Pharmacy  D-AMPHETAMINE  SALT COMBO 15MG  TABS 12/11/2023 30 60 each Sater, Sherida Dimmer, MD [facility] #...  D-AMPHETAMINE  ER 15MG  SALT COMBO CP 10/28/2023 30 30 each Sater, Sherida Dimmer, MD [facility] DR[facility] STORE #...      Rx's pending to be signed

## 2024-02-27 ENCOUNTER — Encounter: Payer: Self-pay | Admitting: Neurology

## 2024-02-27 ENCOUNTER — Ambulatory Visit: Payer: BC Managed Care – PPO | Admitting: Neurology

## 2024-02-27 VITALS — BP 104/75 | HR 91 | Ht 69.0 in | Wt 159.5 lb

## 2024-02-27 DIAGNOSIS — R5382 Chronic fatigue, unspecified: Secondary | ICD-10-CM | POA: Diagnosis not present

## 2024-02-27 DIAGNOSIS — M2540 Effusion, unspecified joint: Secondary | ICD-10-CM

## 2024-02-27 DIAGNOSIS — M79642 Pain in left hand: Secondary | ICD-10-CM

## 2024-02-27 DIAGNOSIS — G35 Multiple sclerosis: Secondary | ICD-10-CM | POA: Diagnosis not present

## 2024-02-27 DIAGNOSIS — Z79899 Other long term (current) drug therapy: Secondary | ICD-10-CM | POA: Diagnosis not present

## 2024-02-27 DIAGNOSIS — G35A Relapsing-remitting multiple sclerosis: Secondary | ICD-10-CM

## 2024-02-27 DIAGNOSIS — M79641 Pain in right hand: Secondary | ICD-10-CM

## 2024-02-27 DIAGNOSIS — G4733 Obstructive sleep apnea (adult) (pediatric): Secondary | ICD-10-CM

## 2024-02-27 DIAGNOSIS — G35D Multiple sclerosis, unspecified: Secondary | ICD-10-CM

## 2024-02-27 MED ORDER — ETODOLAC 400 MG PO TABS: 400.0000 mg | ORAL_TABLET | Freq: Two times a day (BID) | ORAL | 5 refills | Status: AC

## 2024-02-27 MED ORDER — AMPHETAMINE-DEXTROAMPHETAMINE 15 MG PO TABS: 15.0000 mg | ORAL_TABLET | Freq: Two times a day (BID) | ORAL | 0 refills | Status: DC

## 2024-02-27 MED ORDER — AMPHETAMINE-DEXTROAMPHET ER 15 MG PO CP24: 15.0000 mg | ORAL_CAPSULE | ORAL | 0 refills | Status: DC

## 2024-02-27 MED ORDER — FLUCONAZOLE 150 MG PO TABS: 150.0000 mg | ORAL_TABLET | Freq: Every day | ORAL | 0 refills | Status: AC

## 2024-02-27 MED ORDER — METHYLPREDNISOLONE 4 MG PO TABS: ORAL_TABLET | ORAL | 0 refills | Status: AC

## 2024-02-27 NOTE — Progress Notes (Addendum)
 "  [facility]  PATIENT: Tamara Booth DOB: November 30, 1985  REFERRING DOCTOR OR PCP:  Almarie Das SOURCE: Patient, hospital records, imaging and lab reports, MRI images on PACS  _________________________________   HISTORICAL  CHIEF COMPLAINT:  Chief Complaint  Patient presents with   RM10/MS    Pt is here Alone. Pt states things have been going great with her MS since her last appointment. ESS 9. Pt states that her right leg will flare up and it feels like sand and barb wire is in her leg.     HISTORY OF PRESENT ILLNESS:   Tamara Booth is a 38 y.o. woman diagnosed with MS following an episode of left optic neuritis in 2018.  Update 02/27/2024 She is on Ocrevus, last infusion 09/2023 NExt infusion scheduled for August.   She reports flulike symptoms of myalgias for about a week after each Ocrevus infusion.  She is to Ocrevus in 2022 due to breakthrough activity on Vumerity and dimethyl fumarate .  She also notes fatigue is much worse the last 6 weeks of each cycle.  She is having some bilateral hand pain again.   ANA, ESR, RF and ANA were normal/non-contributary.   In the past, etodolac  has been helpful.  A steroid pack may have helped for short while.  She feels neurologically stable with no exacerbations.  Specifically, she has not noted any problems with gait, balance, strength, sensation, or bladder function. She no longer gets hand tingling.    She uses the bannister on stairs for safety.     Bladder function is doing well.     Vision is fine.  She notes more fatigue.   Adderall has helped some.  She falls asleep at 9 pm and often wakes up at 3 pm.   She works remotely and takes a nap at lunch.    She tried trazodone  but had complex sleep walking behavior without recall so stopped.    Mood and cognition are doing well.  Her MS related attention deficit is helped by Adderall XR 15 mg and IR 15 mg po 1-2 times a day.    EPWORTH SLEEPINESS SCALE  On a scale of 0 -  3 what is the chance of dozing:  Sitting and Reading:   0 Watching TV:    1 Sitting inactive in a public place: 2 Passenger in car for one hour: 3 Lying down to rest in the afternoon: 1 Sitting and talking to someone: 0 Sitting quietly after lunch:  3 In a car, stopped in traffic:  0  Total (out of 24): 10/24   mild EDS   MS HISTORY: On 06/28/2017, she felt that there was something in her left eye when she woke up. As the day went on she began to note more difficulty with her vision and this worsened further over the next couple days until she was unable to see. A couple days later she presented to the [facility] MRI was consistent with left optic neuritis and also showed other changes consistent with MS. She received 5 days of IV Solu-Medrol .  Vision improved.  She entered the [id number]-8700 drug study.  The active medication has since been approved by the FDA as Vumerity.  She then switched to dimethyl fumarate  due to some tolerability issues.  However, on dimethyl fumarate  she had breakthrough activity on the brain MRI and.  She switched to Ocrevus in 2022.  Her brother has alopecia.  No FH of MS   IMAGING: MRI of the  brain performed 07/03/2017 shows an enhancing lesion in the left optic nerve just distal to the chiasm. There are multiple T2/FLAIR hyperintense foci in the periventricular, juxtacortical deep white matter of the hemispheres. There are also foci in the pons, left middle cerebellar peduncle, left cerebellar hemisphere and left thalamus  Two of the lesions enhance,  one in the right juxtacortical white matter and another in the left thalamus.  There is a subtle focus at the cervicomedullary junction on the sagittal FLAIR images of the brain that is not seen on the T2-weighted images of the cervical spine performed last week.   MRI of the brain 08/30/2019 showed no enhancing lesion but there was one new lesion in the subcortical right parietal lobe that was not present on the  previous MRI from 09/22/2018 consistent with a new MS plaque that occurred over the prior year.   MRI brain 11/23/2022 sopwed Multiple T2/FLAIR hyperintense foci in the cerebral hemispheres, brainstem, thalamus and left middle cerebellar peduncle in a pattern consistent with chronic demyelinating plaque associated with multiple sclerosis. None of the foci appear to be acute. They do not enhance. Compared to the MRI from 12/22/2020, there are no new lesions.   MRI brain 12/22/2020 showed Multiple T2/FLAIR hyperintense foci in the brainstem, left middle cerebellar peduncle, thalamus and hemispheres in a pattern and configuration consistent with chronic demyelinating plaque associated with multiple sclerosis. The overall pattern is similar to the 08/30/2019 MRI though one focus in the right parietal lobe seen on the current MRI was not present on the previous MRI and is associated with restricted diffusion. This indicates some breakthrough activity.   MRI cervical spnie 11/23/2022 showed Small focus to the left at the cervicomedullary junction was also seen on the MRI from 2018 and is consistent with a chronic demyelinating plaque associated with multiple sclerosis. The rest of the spinal cord appears normal.    REVIEW OF SYSTEMS: Constitutional: No fevers, chills, sweats, or change in appetite Eyes: No visual changes, double vision, eye pain Ear, nose and throat: No hearing loss, ear pain, nasal congestion, sore throat Cardiovascular: No chest pain, palpitations Respiratory:  No shortness of breath at rest or with exertion.   No wheezes GastrointestinaI: No nausea, vomiting, diarrhea, abdominal pain, fecal incontinence Genitourinary:  No dysuria, urinary retention or frequency.  No nocturia. Musculoskeletal:  No neck pain, back pain Integumentary: No rash, pruritus, skin lesions Neurological: as above Psychiatric: No depression at this time.  No anxiety Endocrine: No palpitations, diaphoresis, change in  appetite, change in weigh or increased thirst Hematologic/Lymphatic:  No anemia, purpura, petechiae. Allergic/Immunologic: No itchy/runny eyes, nasal congestion, recent allergic reactions, rashes  ALLERGIES: Allergies  Allergen Reactions   Vicodin [Hydrocodone-Acetaminophen ] Shortness Of Breath and Itching    HOME MEDICATIONS:  Current Outpatient Medications:    etodolac  (LODINE ) 400 MG tablet, Take 1 tablet (400 mg total) by mouth 2 (two) times daily., Disp: 60 tablet, Rfl: 5   ocrelizumab 600 mg in sodium chloride  0.9 % 500 mL, Inject 600 mg into the vein every 6 (six) months., Disp: , Rfl:    amphetamine -dextroamphetamine  (ADDERALL XR) 15 MG 24 hr capsule, Take 1 capsule by mouth every morning., Disp: 30 capsule, Rfl: 0   amphetamine -dextroamphetamine  (ADDERALL) 15 MG tablet, Take 1 tablet by mouth 2 (two) times daily., Disp: 60 tablet, Rfl: 0   etodolac  (LODINE ) 400 MG tablet, Take 1 tablet (400 mg total) by mouth 2 (two) times daily. (Patient not taking: Reported on 02/27/2024), Disp: 60 tablet,  Rfl: 5   fluconazole  (DIFLUCAN ) 150 MG tablet, Take 1 tablet (150 mg total) by mouth daily., Disp: 3 tablet, Rfl: 0   hydrOXYzine  (ATARAX /VISTARIL ) 10 MG tablet, Take 1 tablet (10 mg total) by mouth 3 (three) times daily as needed. (Patient not taking: Reported on 02/27/2024), Disp: 30 tablet, Rfl: 0   Melatonin 2.5 MG CAPS, Take 2.5 mg by mouth at bedtime. (Patient not taking: Reported on 02/27/2024), Disp: , Rfl:    methylPREDNISolone  (MEDROL ) 4 MG tablet, Taper from 6 pills po for one day to 1 pill po the last day over 6 days, Disp: 21 tablet, Rfl: 0   Vitamin D , Ergocalciferol , (DRISDOL ) 1.25 MG (50000 UNIT) CAPS capsule, Take 1 capsule (50,000 Units total) by mouth every 7 (seven) days. (Patient not taking: Reported on 02/27/2024), Disp: 13 capsule, Rfl: 3  PAST MEDICAL HISTORY: Past Medical History:  Diagnosis Date   Optic neuritis 07/03/2017    PAST SURGICAL HISTORY: Past Surgical  History:  Procedure Laterality Date   ABDOMINAL HERNIA REPAIR     at the age of 38   WISDOM TOOTH EXTRACTION      FAMILY HISTORY: Family History  Problem Relation Age of Onset   Pulmonary fibrosis Father    Alopecia Brother    Multiple sclerosis Neg Hx     SOCIAL HISTORY:  Social History   Socioeconomic History   Marital status: Married    Spouse name: Not on file   Number of children: Not on file   Years of education: Not on file   Highest education level: Not on file  Occupational History   Not on file  Tobacco Use   Smoking status: Every Day    Current packs/day: 0.75    Average packs/day: 0.8 packs/day for 16.0 years (12.0 ttl pk-yrs)    Types: Cigarettes   Smokeless tobacco: Never  Vaping Use   Vaping status: Never Used  Substance and Sexual Activity   Alcohol use: No   Drug use: No   Sexual activity: Never  Other Topics Concern   Not on file  Social History Narrative   Not on file   Social Drivers of Health   Tobacco Use: High Risk (02/27/2024)   Patient History    Smoking Tobacco Use: Every Day    Smokeless Tobacco Use: Never    Passive Exposure: Not on file  Financial Resource Strain: Not on file  Food Insecurity: Not on file  Transportation Needs: Not on file  Physical Activity: Not on file  Stress: Not on file  Social Connections: Unknown (10/02/2022)   Received from [facility]   Social Network    Social Network: Not on file  Intimate Partner Violence: Unknown (10/02/2022)   Received from [facility]   HITS    Physically Hurt: Not on file    Insult or Talk Down To: Not on file    Threaten Physical Harm: Not on file    Scream or Curse: Not on file  Depression (PHQ2-9): Not on file  Alcohol Screen: Not on file  Housing: Not on file  Utilities: Not on file  Health Literacy: Not on file     PHYSICAL EXAM  Vitals:   02/27/24 1102  BP: 104/75  Pulse: 91  SpO2: 99%  Weight: 159 lb 8 oz (72.3 kg)  Height: 5' 9 (1.753 m)     Body mass index is 23.55 kg/m.   General: The patient is well-developed and well-nourished and in no acute distress  Skin:  Extremities are without significant edema.  Musculoskeletal: mild tenderness over the joints of the hand..  Joints are not hot or red.   Neurologic Exam  Mental status: The patient is alert and oriented x 3 at the time of the examination. The patient has apparent normal recent and remote memory, with an apparently normal attention span and concentration ability.   Speech is normal.  Cranial nerves: Extraocular movements are full. She has a 1+ left APD.  Color vision was symmetric.  Facial symmetry is present. There is good facial sensation to soft touch bilaterally. Facial strength is normal.  Trapezius and sternocleidomastoid strength is normal. No dysarthria is noted.   No obvious hearing deficits are noted.  Motor:  Muscle bulk is normal.   Tone is normal. Strength is  5 / 5 in all 4 extremities.   Sensory: She has Tinel's signs at the elbows and wrists.   Sensory testing is intact to pinprick, soft touch and vibration sensation in all 4 extremities except reduced vibrion oin the left leg.     Coordination: Cerebellar testing reveals good finger-nose-finger and heel-to-shin bilaterally.  Gait and station: Station is normal.   The gait is normal and tandem gait is minimally wide.  Romberg is negative  Reflexes: Deep tendon reflexes are symmetric and normal bilaterally.      DIAGNOSTIC DATA (LABS, IMAGING, TESTING) - I reviewed patient records, labs, notes, testing and imaging myself where available.  Lab Results  Component Value Date   WBC 8.9 02/27/2024   HGB 15.2 02/27/2024   HCT 44.9 02/27/2024   MCV 100 (H) 02/27/2024   PLT 232 02/27/2024      Component Value Date/Time   NA 138 06/09/2020 0859   K 5.4 (H) 06/09/2020 0859   CL 101 06/09/2020 0859   CO2 24 06/09/2020 0859   GLUCOSE 86 06/09/2020 0859   GLUCOSE 115 (H) 07/02/2017 1834   BUN  11 06/09/2020 0859   CREATININE 0.80 06/09/2020 0859   CALCIUM 9.9 06/09/2020 0859   PROT 6.7 12/28/2020 0842   ALBUMIN 4.6 12/28/2020 0842   AST 12 12/28/2020 0842   ALT 7 12/28/2020 0842   ALKPHOS 58 12/28/2020 0842   BILITOT 0.4 12/28/2020 0842   GFRNONAA 95 06/09/2020 0859   GFRAA 110 06/09/2020 0859       ASSESSMENT AND PLAN  Multiple sclerosis, relapsing-remitting  Multiple sclerosis - Plan: IgG, IgA, IgM, CBC with Differential/Platelet, Hep B Surface Antigen, Hepatitis B Core AB, Total, QuantiFERON-TB Gold Plus  High risk medication use - Plan: IgG, IgA, IgM, CBC with Differential/Platelet, Hep B Surface Antigen, Hepatitis B Core AB, Total, QuantiFERON-TB Gold Plus  Chronic fatigue  OSA (obstructive sleep apnea)  Joint swelling  Bilateral hand pain    1.  She is experience increased fatigue and other symptoms the last 71 to 47 weeks of age Ocrevus cycle.  Additionally, she feels flulike for a week or so after each Ocrevus infusion.  However, it does seem to be effective for her MS.  Therefore, we will make a change from Ocrevus to Briumvi as many patients will have milder infusion reactions and less of a wearing off effect.  Before Ocrevus, she tried and failed Vumerity due to tolerability issues and failed dimethyl fumarate  due to breakthrough activity on brain MRI.  Due to the level of aggressiveness of her MS and in accordance with guidelines from the American Academy of neurology, she needs a highly effective disease modifying therapy.  Glatiramer, teriflunomide and  fingolimod do not have adequate efficacy. 2.   She feels sleep is stable.  She has not been able to lose any weight.  Continue Adderall XR 15 mg in the morning and afternoon Adderall to 15 mg up to 2 times a day or as needed.  3.   She is noting some increase in the hand pain and I have represcribed a steroid pack and etodolac  that has been helpful in the past. 4.   Stay active and exercise as tolerated. 5.    rtc 6 months.    Toriano Aikey A. Vear, MD, PhD, DIEDRA 08/27/2024, 12:50 PM Certified in Neurology, Clinical Neurophysiology, Sleep Medicine, Pain Medicine and Neuroimaging  [facility] [address] [place], [place] [address] [phone] "

## 2024-02-27 NOTE — Addendum Note (Signed)
 Addended by: VEAR ADE A on: 02/27/2024 01:08 PM   Modules accepted: Orders

## 2024-03-03 ENCOUNTER — Ambulatory Visit: Payer: Self-pay | Admitting: Neurology

## 2024-03-03 LAB — CBC WITH DIFFERENTIAL/PLATELET
Basophils Absolute: 0 x10E3/uL (ref 0.0–0.2)
Basos: 0 %
EOS (ABSOLUTE): 0.1 x10E3/uL (ref 0.0–0.4)
Eos: 1 %
Hematocrit: 44.9 % (ref 34.0–46.6)
Hemoglobin: 15.2 g/dL (ref 11.1–15.9)
Immature Grans (Abs): 0 x10E3/uL (ref 0.0–0.1)
Immature Granulocytes: 0 %
Lymphocytes Absolute: 1.8 x10E3/uL (ref 0.7–3.1)
Lymphs: 20 %
MCH: 33.7 pg — ABNORMAL HIGH (ref 26.6–33.0)
MCHC: 33.9 g/dL (ref 31.5–35.7)
MCV: 100 fL — ABNORMAL HIGH (ref 79–97)
Monocytes Absolute: 0.7 x10E3/uL (ref 0.1–0.9)
Monocytes: 8 %
Neutrophils Absolute: 6.3 x10E3/uL (ref 1.4–7.0)
Neutrophils: 71 %
Platelets: 232 x10E3/uL (ref 150–450)
RBC: 4.51 x10E6/uL (ref 3.77–5.28)
RDW: 12.5 % (ref 11.7–15.4)
WBC: 8.9 x10E3/uL (ref 3.4–10.8)

## 2024-03-03 LAB — HEPATITIS B SURFACE ANTIGEN: Hepatitis B Surface Ag: NEGATIVE

## 2024-03-03 LAB — IGG, IGA, IGM
IgA/Immunoglobulin A, Serum: 115 mg/dL (ref 87–352)
IgG (Immunoglobin G), Serum: 755 mg/dL (ref 586–1602)
IgM (Immunoglobulin M), Srm: 142 mg/dL (ref 26–217)

## 2024-03-03 LAB — QUANTIFERON-TB GOLD PLUS
QuantiFERON Mitogen Value: >10 [IU]/mL
QuantiFERON Nil Value: 0.05 [IU]/mL
QuantiFERON TB1 Ag Value: 0.06 [IU]/mL
QuantiFERON TB2 Ag Value: 0.06 [IU]/mL

## 2024-03-03 LAB — HEPATITIS B CORE ANTIBODY, TOTAL: Hep B Core Total Ab: NEGATIVE

## 2024-03-03 NOTE — Telephone Encounter (Addendum)
 Called pt at [phone]. Relayed results per Dr. Duncan note. Pt verbalized understanding. She will be on the look out for calls from [facility] pt support.   Faxed completed/signed Briumvi start form to Briumvi Patient Support at [phone]. Received fax confirmation. Gave signed orders to [facility] to work on approval prior to scheduling. Pt made aware this process can take 2-3 wk depending on insurance.

## 2024-03-12 NOTE — Telephone Encounter (Signed)
 I spoke with [facility] in the infusion suite. Patient is scheduled for 04/15/2024 & 04/29/2024.

## 2024-05-01 ENCOUNTER — Encounter: Payer: Self-pay | Admitting: Neurology

## 2024-05-04 ENCOUNTER — Other Ambulatory Visit: Payer: Self-pay

## 2024-05-04 MED ORDER — AMPHETAMINE-DEXTROAMPHETAMINE 15 MG PO TABS: 15.0000 mg | ORAL_TABLET | Freq: Two times a day (BID) | ORAL | 0 refills | Status: DC

## 2024-05-04 MED ORDER — AMPHETAMINE-DEXTROAMPHET ER 15 MG PO CP24: 15.0000 mg | ORAL_CAPSULE | ORAL | 0 refills | Status: DC

## 2024-05-04 NOTE — Telephone Encounter (Signed)
 Pt Last Seen 02/27/2024 Upcoming Appointment 09/30/2024  Adderall 15 mg 24hr Capsule Last filled 02/27/2024 Adderall 15mg  Tablets Last filled 02/27/2024

## 2024-06-08 ENCOUNTER — Encounter: Payer: Self-pay | Admitting: Neurology

## 2024-06-08 MED ORDER — AMPHETAMINE-DEXTROAMPHET ER 15 MG PO CP24: 15.0000 mg | ORAL_CAPSULE | ORAL | 0 refills | Status: DC

## 2024-06-08 MED ORDER — AMPHETAMINE-DEXTROAMPHETAMINE 15 MG PO TABS: 15.0000 mg | ORAL_TABLET | Freq: Two times a day (BID) | ORAL | 0 refills | Status: DC

## 2024-06-08 NOTE — Telephone Encounter (Signed)
 Last seen 02/27/24 Follow up scheduled on 09/30/24   D-AMPHETAMINE  ER 15MG  SALT COMBO CP 05/05/2024 30 30 each Dohmeier, Dedra, MD [facility] DR[facility] STORE #...  D-AMPHETAMINE  SALT COMBO 15MG  TABS 05/05/2024 30 60 each Dohmeier, Dedra, MD [facility] STORE #...   Rx pending to be signed

## 2024-07-22 ENCOUNTER — Encounter: Payer: Self-pay | Admitting: Neurology

## 2024-07-23 MED ORDER — AMPHETAMINE-DEXTROAMPHET ER 15 MG PO CP24: 15.0000 mg | ORAL_CAPSULE | ORAL | 0 refills | Status: DC

## 2024-07-23 MED ORDER — AMPHETAMINE-DEXTROAMPHETAMINE 15 MG PO TABS: 15.0000 mg | ORAL_TABLET | Freq: Two times a day (BID) | ORAL | 0 refills | Status: DC

## 2024-07-23 NOTE — Telephone Encounter (Signed)
 Requested Prescriptions   Pending Prescriptions Disp Refills   amphetamine -dextroamphetamine  (ADDERALL XR) 15 MG 24 hr capsule 30 capsule 0    Sig: Take 1 capsule by mouth every morning.   Last seen 02/27/24 Next appt 09/30/24 Dispenses   Dispensed Days Supply Quantity Provider Pharmacy  D-AMPHETAMINE  SALT COMBO 15MG  TABS 06/09/2024 30 60 each Sater, Charlie LABOR, MD [facility] DRUG STORE #...  D-AMPHETAMINE  ER 15MG  SALT COMBO CP 06/08/2024 30 30 each Sater, Charlie LABOR, MD [facility] DRUG STORE #...  D-AMPHETAMINE  ER 15MG  SALT COMBO CP 05/05/2024 30 30 each Dohmeier, Dedra, MD [facility] DRUG STORE #...  D-AMPHETAMINE  SALT COMBO 15MG  TABS 05/05/2024 30 60 each Dohmeier, Dedra, MD [facility] DRUG STORE #...  D-AMPHETAMINE  ER 15MG  SALT COMBO CP 02/27/2024 30 30 each Sater, Charlie LABOR, MD [facility] DR[facility] STORE #...  D-AMPHETAMINE  SALT COMBO 15MG  TABS 02/27/2024 30 60 each Sater, Charlie LABOR, MD [facility] DRUG STORE #...  D-AMPHETAMINE  ER 15MG  SALT COMBO CP 01/20/2024 30 30 each Sater, Charlie LABOR, MD [facility] DRUG STORE #...  D-AMPHETAMINE  SALT COMBO 15MG  TABS 01/20/2024 30 60 each Sater, Charlie LABOR, MD [facility] DRUG STORE #...  D-AMPHETAMINE  SALT COMBO 15MG  TABS 12/11/2023 30 60 each Sater, Charlie LABOR, MD [facility] DRUG STORE #...  D-AMPHETAMINE  ER 15MG  SALT COMBO CP 10/28/2023 30 30 each Sater, Charlie LABOR, MD [facility] DRUG STORE #...  D-AMPHETAMINE  SALT COMBO 15MG  TABS 10/28/2023 30 60 each Sater, Charlie LABOR, MD [facility] DRUG STORE #...  D-AMPHETAMINE  ER 15MG  SALT COMBO CP 09/19/2023 30 30 each Sater, Charlie LABOR, MD [facility] DRUG STORE #...  D-AMPHETAMINE  SALT COMBO 15MG  TABS 09/19/2023 30 60 each Sater, Charlie LABOR, MD [facility] DRUG STORE #...  D-AMPHETAMINE  SALT COMBO 15MG  TABS 08/19/2023 30 60 each Sater, Charlie LABOR, MD [facility] DRUG STORE #.[place]

## 2024-07-23 NOTE — Addendum Note (Signed)
 Addended by: HILLIARD HEATHER CROME on: 07/23/2024 02:55 PM   Modules accepted: Orders

## 2024-08-24 ENCOUNTER — Encounter: Payer: Self-pay | Admitting: Neurology

## 2024-08-24 MED ORDER — AMPHETAMINE-DEXTROAMPHETAMINE 15 MG PO TABS: 15.0000 mg | ORAL_TABLET | Freq: Two times a day (BID) | ORAL | 0 refills | Status: AC

## 2024-08-24 MED ORDER — AMPHETAMINE-DEXTROAMPHET ER 15 MG PO CP24: 15.0000 mg | ORAL_CAPSULE | ORAL | 0 refills | Status: AC

## 2024-08-24 NOTE — Telephone Encounter (Signed)
 Requested Prescriptions   Pending Prescriptions Disp Refills   amphetamine -dextroamphetamine  (ADDERALL XR) 15 MG 24 hr capsule 30 capsule 0    Sig: Take 1 capsule by mouth every morning.   amphetamine -dextroamphetamine  (ADDERALL) 15 MG tablet 60 tablet 0    Sig: Take 1 tablet by mouth 2 (two) times daily.   Last seen 02/27/24, next appt 09/30/24 Dispenses   Dispensed Days Supply Quantity Provider Pharmacy  D-AMPHETAMINE  ER 15MG  SALT COMBO CP 07/23/2024 30 30 each Sater, Charlie LABOR, MD [facility] DRUG STORE #...  D-AMPHETAMINE  SALT COMBO 15MG  TABS 07/23/2024 30 60 each Sater, Charlie LABOR, MD [facility] DRUG STORE #...  D-AMPHETAMINE  SALT COMBO 15MG  TABS 06/09/2024 30 60 each Sater, Charlie LABOR, MD [facility] DRUG STORE #...  D-AMPHETAMINE  ER 15MG  SALT COMBO CP 06/08/2024 30 30 each Sater, Charlie LABOR, MD [facility] DRUG STORE #...  D-AMPHETAMINE  ER 15MG  SALT COMBO CP 05/05/2024 30 30 each Dohmeier, Dedra, MD [facility] DRUG STORE #...  D-AMPHETAMINE  SALT COMBO 15MG  TABS 05/05/2024 30 60 each Dohmeier, Dedra, MD [facility] DRUG STORE #...  D-AMPHETAMINE  ER 15MG  SALT COMBO CP 02/27/2024 30 30 each Sater, Charlie LABOR, MD [facility] DRUG STORE #...  D-AMPHETAMINE  SALT COMBO 15MG  TABS 02/27/2024 30 60 each Sater, Charlie LABOR, MD [facility] DRUG STORE #...  D-AMPHETAMINE  ER 15MG  SALT COMBO CP 01/20/2024 30 30 each Sater, Charlie LABOR, MD [facility] DRUG STORE #...  D-AMPHETAMINE  SALT COMBO 15MG  TABS 01/20/2024 30 60 each Sater, Charlie LABOR, MD [facility] DRUG STORE #...  D-AMPHETAMINE  SALT COMBO 15MG  TABS 12/11/2023 30 60 each Sater, Charlie LABOR, MD [facility] DRUG STORE #...  D-AMPHETAMINE  ER 15MG  SALT COMBO CP 10/28/2023 30 30 each Sater, Charlie LABOR, MD [facility] DRUG STORE #...  D-AMPHETAMINE  SALT COMBO 15MG  TABS 10/28/2023 30 60 each Sater, Charlie LABOR, MD [facility] DRUG STORE #...  D-AMPHETAMINE  ER 15MG  SALT COMBO CP 09/19/2023 30 30 each Sater, Charlie LABOR, MD [facility] DRUG STORE #...  D-AMPHETAMINE  SALT COMBO 15MG  TABS  09/19/2023 30 60 each Sater, Charlie LABOR, MD [facility] DRUG STORE #.[place]

## 2024-08-27 ENCOUNTER — Telehealth: Payer: Self-pay | Admitting: *Deleted

## 2024-08-27 ENCOUNTER — Encounter: Payer: Self-pay | Admitting: Neurology

## 2024-08-27 NOTE — Telephone Encounter (Signed)
" °  Updated dx code placed on form for Briumvi.  "

## 2024-08-27 NOTE — Telephone Encounter (Signed)
 [facility] sent a fax requesting updated MS Dx code from Dr Vear.

## 2024-09-30 ENCOUNTER — Ambulatory Visit: Admitting: Neurology
